# Patient Record
Sex: Male | Born: 1970 | Race: White | Hispanic: No | Marital: Married | Smoking: Former smoker
Health system: Southern US, Community
[De-identification: ages and names within clinical notes are randomized; demographics above are authoritative.]

## PROBLEM LIST (undated history)

## (undated) DIAGNOSIS — I208 Other forms of angina pectoris: Secondary | ICD-10-CM

## (undated) DIAGNOSIS — S86319A Strain of muscle(s) and tendon(s) of peroneal muscle group at lower leg level, unspecified leg, initial encounter: Secondary | ICD-10-CM

## (undated) DIAGNOSIS — M1811 Unilateral primary osteoarthritis of first carpometacarpal joint, right hand: Secondary | ICD-10-CM

## (undated) DIAGNOSIS — F17200 Nicotine dependence, unspecified, uncomplicated: Secondary | ICD-10-CM

## (undated) DIAGNOSIS — E785 Hyperlipidemia, unspecified: Secondary | ICD-10-CM

## (undated) DIAGNOSIS — N139 Obstructive and reflux uropathy, unspecified: Secondary | ICD-10-CM

## (undated) DIAGNOSIS — M75102 Unspecified rotator cuff tear or rupture of left shoulder, not specified as traumatic: Secondary | ICD-10-CM

## (undated) DIAGNOSIS — R079 Chest pain, unspecified: Secondary | ICD-10-CM

## (undated) DIAGNOSIS — Z982 Presence of cerebrospinal fluid drainage device: Secondary | ICD-10-CM

## (undated) DIAGNOSIS — M47816 Spondylosis without myelopathy or radiculopathy, lumbar region: Secondary | ICD-10-CM

## (undated) DIAGNOSIS — Z91030 Bee allergy status: Secondary | ICD-10-CM

## (undated) DIAGNOSIS — R52 Pain, unspecified: Secondary | ICD-10-CM

## (undated) DIAGNOSIS — M170 Bilateral primary osteoarthritis of knee: Secondary | ICD-10-CM

## (undated) DIAGNOSIS — Z0000 Encounter for general adult medical examination without abnormal findings: Secondary | ICD-10-CM

## (undated) DIAGNOSIS — M19071 Primary osteoarthritis, right ankle and foot: Secondary | ICD-10-CM

## (undated) HISTORY — PX: ANTERIOR CRUCIATE LIGAMENT REPAIR: SHX115

## (undated) HISTORY — DX: Obstructive and reflux uropathy, unspecified: N13.9

## (undated) HISTORY — DX: Encounter for general adult medical examination without abnormal findings: Z00.00

## (undated) HISTORY — DX: Nicotine dependence, unspecified, uncomplicated: F17.200

## (undated) HISTORY — DX: Chest pain, unspecified: R07.9

## (undated) HISTORY — DX: Unspecified rotator cuff tear or rupture of left shoulder, not specified as traumatic: M75.102

## (undated) HISTORY — DX: Spondylosis without myelopathy or radiculopathy, lumbar region: M47.816

## (undated) HISTORY — DX: Presence of cerebrospinal fluid drainage device: Z98.2

## (undated) HISTORY — DX: Bilateral primary osteoarthritis of knee: M17.0

## (undated) HISTORY — PX: VENTRICULOPERITONEAL SHUNT: SHX204

## (undated) HISTORY — DX: Unilateral primary osteoarthritis of first carpometacarpal joint, right hand: M18.11

## (undated) HISTORY — DX: Hyperlipidemia, unspecified: E78.5

## (undated) HISTORY — DX: Primary osteoarthritis, right ankle and foot: M19.071

## (undated) HISTORY — PX: OTHER SURGICAL HISTORY: SHX169

## (undated) HISTORY — DX: Strain of muscle(s) and tendon(s) of peroneal muscle group at lower leg level, unspecified leg, initial encounter: S86.319A

## (undated) HISTORY — DX: Other forms of angina pectoris: I20.8

---

## 2007-07-16 DIAGNOSIS — Z982 Presence of cerebrospinal fluid drainage device: Secondary | ICD-10-CM

## 2007-07-16 HISTORY — DX: Presence of cerebrospinal fluid drainage device: Z98.2

## 2008-08-15 HISTORY — PX: OTHER SURGICAL HISTORY: SHX169

## 2012-04-13 HISTORY — PX: OTHER SURGICAL HISTORY: SHX169

## 2012-06-08 ENCOUNTER — Encounter (HOSPITAL_COMMUNITY): Payer: Self-pay | Admitting: Pharmacy Technician

## 2012-06-09 ENCOUNTER — Other Ambulatory Visit: Payer: Self-pay | Admitting: Orthopedic Surgery

## 2012-06-16 ENCOUNTER — Encounter (HOSPITAL_COMMUNITY): Payer: Self-pay

## 2012-06-16 ENCOUNTER — Encounter (HOSPITAL_COMMUNITY)
Admission: RE | Admit: 2012-06-16 | Discharge: 2012-06-16 | Disposition: A | Discharge status: Home / Self Care | Payer: BC Managed Care – PPO | Source: Ambulatory Visit | Attending: Specialist | Admitting: Specialist

## 2012-06-16 HISTORY — DX: Presence of cerebrospinal fluid drainage device: Z98.2

## 2012-06-16 HISTORY — DX: Pain, unspecified: R52

## 2012-06-16 LAB — SURGICAL PCR SCREEN: Staphylococcus aureus: POSITIVE — AB

## 2012-06-16 NOTE — Patient Instructions (Signed)
YOUR SURGERY IS SCHEDULED AT [facility]  ON:  Thursday  12/5  AT 7:30 AM  REPORT TO [place] [facility] AT:  5:30 AM      PHONE # FOR SHORT STAY IS [phone]  DO NOT EAT OR DRINK ANYTHING AFTER MIDNIGHT THE NIGHT BEFORE YOUR SURGERY.  YOU MAY BRUSH YOUR TEETH, RINSE OUT YOUR MOUTH--BUT NO WATER, NO FOOD, NO CHEWING GUM, NO MINTS, NO CANDIES, NO CHEWING TOBACCO.  PLEASE TAKE THE FOLLOWING MEDICATIONS THE AM OF YOUR SURGERY WITH A FEW SIPS OF WATER:  NO MEDS TO TAKE   IF YOU USE INHALERS--USE YOUR INHALERS THE AM OF YOUR SURGERY AND BRING INHALERS TO THE HOSPITAL -TAKE TO SURGERY.    IF YOU ARE DIABETIC:  DO NOT TAKE ANY DIABETIC MEDICATIONS THE AM OF YOUR SURGERY.  IF YOU TAKE INSULIN IN THE EVENINGS--PLEASE ONLY TAKE 1/2 NORMAL EVENING DOSE THE NIGHT BEFORE YOUR SURGERY.  NO INSULIN THE AM OF YOUR SURGERY.  IF YOU HAVE SLEEP APNEA AND USE CPAP OR BIPAP--PLEASE BRING THE MASK AND THE TUBING.  DO NOT BRING YOUR MACHINE.  DO NOT BRING VALUABLES, MONEY, CREDIT CARDS.  DO NOT WEAR JEWELRY, MAKE-UP, NAIL POLISH AND NO METAL PINS OR CLIPS IN YOUR HAIR. CONTACT LENS, DENTURES / PARTIALS, GLASSES SHOULD NOT BE WORN TO SURGERY AND IN MOST CASES-HEARING AIDS WILL NEED TO BE REMOVED.  BRING YOUR GLASSES CASE, ANY EQUIPMENT NEEDED FOR YOUR CONTACT LENS. FOR PATIENTS ADMITTED TO THE HOSPITAL--CHECK OUT TIME THE DAY OF DISCHARGE IS 11:00 AM.  ALL INPATIENT ROOMS ARE PRIVATE - WITH BATHROOM, TELEPHONE, TELEVISION AND WIFI INTERNET.  IF YOU ARE BEING DISCHARGED THE SAME DAY OF YOUR SURGERY--YOU CAN NOT DRIVE YOURSELF HOME--AND SHOULD NOT GO HOME ALONE BY TAXI OR BUS.  NO DRIVING OR OPERATING MACHINERY FOR 24 HOURS FOLLOWING ANESTHESIA / PAIN MEDICATIONS.  PLEASE MAKE ARRANGEMENTS FOR SOMEONE TO BE WITH YOU AT HOME THE FIRST 24 HOURS AFTER SURGERY. RESPONSIBLE DRIVER'S NAME___________________________                                               PHONE #   _______________________                              PLEASE READ OVER ANY  FACT SHEETS THAT YOU WERE GIVEN: MRSA INFORMATION, BLOOD TRANSFUSION INFORMATION, INCENTIVE SPIROMETER INFORMATION.

## 2012-06-16 NOTE — Pre-Procedure Instructions (Signed)
PT STATES HE JUST HAD PHYSICAL THIS AM 06/16/12 AT DR. Domingo Cocking AT [place] AND HAD CBC, CMET, LIPID PANEL, TSH AND UA DONE AND PT BROUGHT EKG THAT WAS DONE-NORMAL.  LAB  REPORTS WILL BE REQUESTED.  PCR WAS DONE TODAY AT [facility] PREOP--NO OTHER LABS NEEDED. PT HAD HEART TREADMILL STRESS TEST AT [facility] 08/20/11--NORMAL - REPORT ON CHART.

## 2012-06-17 NOTE — Pre-Procedure Instructions (Signed)
PT'S CBC, CMET, UA, TSH, UA AND LIPID PANEL REPORTS ON CHART -DONE 06/16/12 AND FAXED BY HIS MEDICAL DOCTOR'S OFFICE--DR. JOBE WITH [facility].

## 2012-06-18 ENCOUNTER — Encounter (HOSPITAL_COMMUNITY): Payer: Self-pay | Admitting: Anesthesiology

## 2012-06-18 ENCOUNTER — Ambulatory Visit (HOSPITAL_COMMUNITY)
Admission: RE | Admit: 2012-06-18 | Discharge: 2012-06-18 | Disposition: A | Discharge status: Home / Self Care | Payer: BC Managed Care – PPO | Source: Ambulatory Visit | Attending: Specialist | Admitting: Specialist

## 2012-06-18 ENCOUNTER — Encounter (HOSPITAL_COMMUNITY): Payer: Self-pay | Admitting: *Deleted

## 2012-06-18 ENCOUNTER — Ambulatory Visit (HOSPITAL_COMMUNITY): Payer: BC Managed Care – PPO | Admitting: Anesthesiology

## 2012-06-18 ENCOUNTER — Encounter (HOSPITAL_COMMUNITY)
Admission: RE | Disposition: A | Discharge status: Home / Self Care | Payer: Self-pay | Source: Ambulatory Visit | Attending: Specialist

## 2012-06-18 DIAGNOSIS — Z5333 Arthroscopic surgical procedure converted to open procedure: Secondary | ICD-10-CM | POA: Insufficient documentation

## 2012-06-18 DIAGNOSIS — M25819 Other specified joint disorders, unspecified shoulder: Secondary | ICD-10-CM | POA: Insufficient documentation

## 2012-06-18 DIAGNOSIS — Z7982 Long term (current) use of aspirin: Secondary | ICD-10-CM | POA: Insufficient documentation

## 2012-06-18 DIAGNOSIS — M75102 Unspecified rotator cuff tear or rupture of left shoulder, not specified as traumatic: Secondary | ICD-10-CM

## 2012-06-18 DIAGNOSIS — Z01812 Encounter for preprocedural laboratory examination: Secondary | ICD-10-CM | POA: Insufficient documentation

## 2012-06-18 DIAGNOSIS — S43429A Sprain of unspecified rotator cuff capsule, initial encounter: Secondary | ICD-10-CM | POA: Insufficient documentation

## 2012-06-18 DIAGNOSIS — Z79899 Other long term (current) drug therapy: Secondary | ICD-10-CM | POA: Insufficient documentation

## 2012-06-18 DIAGNOSIS — X58XXXA Exposure to other specified factors, initial encounter: Secondary | ICD-10-CM | POA: Insufficient documentation

## 2012-06-18 DIAGNOSIS — Z982 Presence of cerebrospinal fluid drainage device: Secondary | ICD-10-CM | POA: Insufficient documentation

## 2012-06-18 HISTORY — PX: SHOULDER ARTHROSCOPY WITH ROTATOR CUFF REPAIR AND SUBACROMIAL DECOMPRESSION: SHX5686

## 2012-06-18 HISTORY — DX: Unspecified rotator cuff tear or rupture of left shoulder, not specified as traumatic: M75.102

## 2012-06-18 SURGERY — SHOULDER ARTHROSCOPY WITH ROTATOR CUFF REPAIR AND SUBACROMIAL DECOMPRESSION
Anesthesia: General | Site: Shoulder | Laterality: Left | Wound class: Clean

## 2012-06-18 MED ORDER — STERILE WATER FOR IRRIGATION IR SOLN
Status: DC | PRN
  Administered 2012-06-18: 09:00:00

## 2012-06-18 MED ORDER — CEFAZOLIN SODIUM-DEXTROSE 2-3 GM-% IV SOLR
2.0000 g | Freq: Three times a day (TID) | INTRAVENOUS | Status: DC
  Filled 2012-06-18 (×2): qty 50

## 2012-06-18 MED ORDER — VANCOMYCIN HCL 1000 MG IV SOLR
1000.0000 mg | INTRAVENOUS | Status: DC | PRN
  Administered 2012-06-18: 1000 mg via INTRAVENOUS

## 2012-06-18 MED ORDER — ACETAMINOPHEN 650 MG RE SUPP: 650.0000 mg | Freq: Four times a day (QID) | RECTAL | Status: DC | PRN

## 2012-06-18 MED ORDER — ONDANSETRON HCL 4 MG/2ML IJ SOLN: 4.0000 mg | Freq: Four times a day (QID) | INTRAMUSCULAR | Status: DC | PRN

## 2012-06-18 MED ORDER — CEFAZOLIN SODIUM-DEXTROSE 2-3 GM-% IV SOLR
INTRAVENOUS | Status: AC
  Filled 2012-06-18: qty 50

## 2012-06-18 MED ORDER — KETOROLAC TROMETHAMINE 30 MG/ML IJ SOLN
30.0000 mg | Freq: Four times a day (QID) | INTRAMUSCULAR | Status: DC
  Administered 2012-06-18: 30 mg via INTRAVENOUS
  Filled 2012-06-18: qty 1

## 2012-06-18 MED ORDER — VANCOMYCIN HCL 10 G IV SOLR
1250.0000 mg | Freq: Two times a day (BID) | INTRAVENOUS | Status: DC
  Filled 2012-06-18: qty 1250

## 2012-06-18 MED ORDER — VANCOMYCIN HCL IN DEXTROSE 1-5 GM/200ML-% IV SOLN
INTRAVENOUS | Status: AC
  Filled 2012-06-18: qty 200

## 2012-06-18 MED ORDER — CHLORHEXIDINE GLUCONATE 4 % EX LIQD
60.0000 mL | Freq: Once | CUTANEOUS | Status: DC
  Filled 2012-06-18: qty 60

## 2012-06-18 MED ORDER — MENTHOL 3 MG MT LOZG: 1.0000 | LOZENGE | OROMUCOSAL | Status: DC | PRN

## 2012-06-18 MED ORDER — ENOXAPARIN SODIUM 40 MG/0.4ML ~~LOC~~ SOLN
40.0000 mg | SUBCUTANEOUS | Status: DC
  Filled 2012-06-18: qty 0.4

## 2012-06-18 MED ORDER — HYDROMORPHONE HCL PF 1 MG/ML IJ SOLN
INTRAMUSCULAR | Status: AC
  Filled 2012-06-18: qty 1

## 2012-06-18 MED ORDER — OXYCODONE-ACETAMINOPHEN 5-325 MG PO TABS
1.0000 | ORAL_TABLET | ORAL | Status: DC | PRN
  Administered 2012-06-18: 2 via ORAL
  Filled 2012-06-18: qty 2

## 2012-06-18 MED ORDER — CEFAZOLIN SODIUM-DEXTROSE 2-3 GM-% IV SOLR
2.0000 g | INTRAVENOUS | Status: AC
  Administered 2012-06-18: 2 g via INTRAVENOUS

## 2012-06-18 MED ORDER — PHENOL 1.4 % MT LIQD: 1.0000 | OROMUCOSAL | Status: DC | PRN

## 2012-06-18 MED ORDER — METHOCARBAMOL 500 MG PO TABS: 500.0000 mg | ORAL_TABLET | Freq: Three times a day (TID) | ORAL | Status: DC

## 2012-06-18 MED ORDER — BUPIVACAINE-EPINEPHRINE 0.5% -1:200000 IJ SOLN
INTRAMUSCULAR | Status: DC | PRN
  Administered 2012-06-18: 20 mL

## 2012-06-18 MED ORDER — LACTATED RINGERS IV SOLN: INTRAVENOUS | Status: DC

## 2012-06-18 MED ORDER — METHOCARBAMOL 100 MG/ML IJ SOLN
500.0000 mg | Freq: Four times a day (QID) | INTRAVENOUS | Status: DC | PRN
  Administered 2012-06-18: 500 mg via INTRAVENOUS
  Filled 2012-06-18: qty 5

## 2012-06-18 MED ORDER — LACTATED RINGERS IR SOLN
Status: DC | PRN
  Administered 2012-06-18: 3000 mL

## 2012-06-18 MED ORDER — ACETAMINOPHEN 10 MG/ML IV SOLN
INTRAVENOUS | Status: DC | PRN
  Administered 2012-06-18: 1000 mg via INTRAVENOUS

## 2012-06-18 MED ORDER — BUPIVACAINE-EPINEPHRINE 0.5% -1:200000 IJ SOLN
INTRAMUSCULAR | Status: AC
  Filled 2012-06-18: qty 1

## 2012-06-18 MED ORDER — LACTATED RINGERS IV SOLN
INTRAVENOUS | Status: DC
  Administered 2012-06-18 (×2): via INTRAVENOUS

## 2012-06-18 MED ORDER — MIDAZOLAM HCL 5 MG/5ML IJ SOLN
INTRAMUSCULAR | Status: DC | PRN
  Administered 2012-06-18: 2 mg via INTRAVENOUS

## 2012-06-18 MED ORDER — DEXAMETHASONE SODIUM PHOSPHATE 10 MG/ML IJ SOLN
INTRAMUSCULAR | Status: DC | PRN
  Administered 2012-06-18: 10 mg via INTRAVENOUS

## 2012-06-18 MED ORDER — ACETAMINOPHEN 325 MG PO TABS: 650.0000 mg | ORAL_TABLET | Freq: Four times a day (QID) | ORAL | Status: DC | PRN

## 2012-06-18 MED ORDER — LIDOCAINE HCL (CARDIAC) 20 MG/ML IV SOLN
INTRAVENOUS | Status: DC | PRN
  Administered 2012-06-18: 50 mg via INTRAVENOUS

## 2012-06-18 MED ORDER — DOCUSATE SODIUM 100 MG PO CAPS: 100.0000 mg | ORAL_CAPSULE | Freq: Two times a day (BID) | ORAL | Status: DC

## 2012-06-18 MED ORDER — DOXYCYCLINE HYCLATE 100 MG PO CAPS: 100.0000 mg | ORAL_CAPSULE | Freq: Two times a day (BID) | ORAL | Status: DC

## 2012-06-18 MED ORDER — ONDANSETRON HCL 4 MG PO TABS: 4.0000 mg | ORAL_TABLET | Freq: Four times a day (QID) | ORAL | Status: DC | PRN

## 2012-06-18 MED ORDER — PROPOFOL 10 MG/ML IV BOLUS
INTRAVENOUS | Status: DC | PRN
  Administered 2012-06-18: 200 mg via INTRAVENOUS

## 2012-06-18 MED ORDER — ACETAMINOPHEN 10 MG/ML IV SOLN
INTRAVENOUS | Status: AC
  Filled 2012-06-18: qty 100

## 2012-06-18 MED ORDER — OXYCODONE-ACETAMINOPHEN 7.5-325 MG PO TABS: 1.0000 | ORAL_TABLET | ORAL | Status: DC | PRN

## 2012-06-18 MED ORDER — ONDANSETRON HCL 4 MG/2ML IJ SOLN
INTRAMUSCULAR | Status: DC | PRN
  Administered 2012-06-18: 4 mg via INTRAVENOUS

## 2012-06-18 MED ORDER — METHOCARBAMOL 500 MG PO TABS: 500.0000 mg | ORAL_TABLET | Freq: Four times a day (QID) | ORAL | Status: DC | PRN

## 2012-06-18 MED ORDER — EPINEPHRINE HCL 1 MG/ML IJ SOLN
INTRAMUSCULAR | Status: DC | PRN
  Administered 2012-06-18: 2 mg

## 2012-06-18 MED ORDER — SUCCINYLCHOLINE CHLORIDE 20 MG/ML IJ SOLN
INTRAMUSCULAR | Status: DC | PRN
  Administered 2012-06-18: 100 mg via INTRAVENOUS

## 2012-06-18 MED ORDER — HYDROMORPHONE HCL PF 1 MG/ML IJ SOLN
0.2500 mg | INTRAMUSCULAR | Status: DC | PRN
  Administered 2012-06-18: 1 mg via INTRAVENOUS
  Administered 2012-06-18 (×2): 0.5 mg via INTRAVENOUS

## 2012-06-18 MED ORDER — EPHEDRINE SULFATE 50 MG/ML IJ SOLN
INTRAMUSCULAR | Status: DC | PRN
  Administered 2012-06-18 (×2): 5 mg via INTRAVENOUS

## 2012-06-18 MED ORDER — EPINEPHRINE HCL 1 MG/ML IJ SOLN
INTRAMUSCULAR | Status: AC
  Filled 2012-06-18: qty 2

## 2012-06-18 MED ORDER — SODIUM CHLORIDE 0.45 % IV SOLN
INTRAVENOUS | Status: DC
  Administered 2012-06-18: 11:00:00 via INTRAVENOUS

## 2012-06-18 MED ORDER — HYDROCODONE-ACETAMINOPHEN 7.5-325 MG PO TABS: 1.0000 | ORAL_TABLET | ORAL | Status: DC | PRN

## 2012-06-18 MED ORDER — HYDROMORPHONE HCL PF 1 MG/ML IJ SOLN
0.5000 mg | INTRAMUSCULAR | Status: DC | PRN
  Administered 2012-06-18: 0.5 mg via INTRAVENOUS
  Filled 2012-06-18: qty 1

## 2012-06-18 MED ORDER — FENTANYL CITRATE 0.05 MG/ML IJ SOLN
INTRAMUSCULAR | Status: DC | PRN
  Administered 2012-06-18 (×2): 100 ug via INTRAVENOUS
  Administered 2012-06-18: 50 ug via INTRAVENOUS

## 2012-06-18 MED ORDER — METOCLOPRAMIDE HCL 5 MG/ML IJ SOLN: 5.0000 mg | Freq: Three times a day (TID) | INTRAMUSCULAR | Status: DC | PRN

## 2012-06-18 MED ORDER — METOCLOPRAMIDE HCL 10 MG PO TABS: 5.0000 mg | ORAL_TABLET | Freq: Three times a day (TID) | ORAL | Status: DC | PRN

## 2012-06-18 SURGICAL SUPPLY — 43 items
ANCHOR ROTATOR CUFF #2 (Anchor) ×2 IMPLANT
BLADE CUTTER GATOR 3.5 (BLADE) ×2 IMPLANT
BLADE SURG SZ11 CARB STEEL (BLADE) ×2 IMPLANT
BUR OVAL 4.0 (BURR) ×2 IMPLANT
CANNULA ACUFO 5X76 (CANNULA) ×2 IMPLANT
CHLORAPREP W/TINT 26ML (MISCELLANEOUS) IMPLANT
CLOTH BEACON ORANGE TIMEOUT ST (SAFETY) ×2 IMPLANT
DECANTER SPIKE VIAL GLASS SM (MISCELLANEOUS) ×2 IMPLANT
DRAPE ORTHO SPLIT 77X108 STRL (DRAPES)
DRAPE SURG ORHT 6 SPLT 77X108 (DRAPES) IMPLANT
DRAPE U-SHAPE 47X51 STRL (DRAPES) ×2 IMPLANT
DRSG EMULSION OIL 3X3 NADH (GAUZE/BANDAGES/DRESSINGS) ×2 IMPLANT
DRSG PAD ABDOMINAL 8X10 ST (GAUZE/BANDAGES/DRESSINGS) ×2 IMPLANT
DURAPREP 26ML APPLICATOR (WOUND CARE) ×2 IMPLANT
GLOVE BIOGEL PI IND STRL 7.5 (GLOVE) ×1 IMPLANT
GLOVE BIOGEL PI IND STRL 8 (GLOVE) ×1 IMPLANT
GLOVE BIOGEL PI INDICATOR 7.5 (GLOVE) ×1
GLOVE BIOGEL PI INDICATOR 8 (GLOVE) ×1
GLOVE SURG SS PI 7.0 STRL IVOR (GLOVE) ×2 IMPLANT
GLOVE SURG SS PI 8.0 STRL IVOR (GLOVE) ×4 IMPLANT
GOWN PREVENTION PLUS LG XLONG (DISPOSABLE) ×2 IMPLANT
GOWN PREVENTION PLUS XLARGE (GOWN DISPOSABLE) ×2 IMPLANT
GOWN STRL REIN XL XLG (GOWN DISPOSABLE) ×2 IMPLANT
KIT BASIN OR (CUSTOM PROCEDURE TRAY) ×2 IMPLANT
MANIFOLD NEPTUNE II (INSTRUMENTS) ×2 IMPLANT
NEEDLE SPNL 18GX3.5 QUINCKE PK (NEEDLE) ×2 IMPLANT
PACK SHOULDER CUSTOM OPM052 (CUSTOM PROCEDURE TRAY) ×2 IMPLANT
POSITIONER SURGICAL ARM (MISCELLANEOUS) ×2 IMPLANT
PUSHLOCK PEEK 4.5X24 (Orthopedic Implant) ×2 IMPLANT
SET ARTHROSCOPY TUBING (MISCELLANEOUS) ×1
SET ARTHROSCOPY TUBING LN (MISCELLANEOUS) ×1 IMPLANT
SLING ARM IMMOBILIZER LRG (SOFTGOODS) IMPLANT
SLING ARM IMMOBILIZER MED (SOFTGOODS) ×2 IMPLANT
SPONGE GAUZE 4X4 12PLY (GAUZE/BANDAGES/DRESSINGS) ×2 IMPLANT
STRAP CHIN BCCS-OSI (MISCELLANEOUS) ×2 IMPLANT
STRIP CLOSURE SKIN 1/2X4 (GAUZE/BANDAGES/DRESSINGS) ×4 IMPLANT
SUT ETHILON 4 0 PS 2 18 (SUTURE) ×2 IMPLANT
SUT PROLENE 3 0 PS 1 (SUTURE) ×2 IMPLANT
SUT VIC AB 1-0 CT2 27 (SUTURE) ×2 IMPLANT
SUT VIC AB 2-0 CT2 27 (SUTURE) ×2 IMPLANT
SUT VICRYL 0-0 OS 2 NEEDLE (SUTURE) ×4 IMPLANT
TUBING CONNECTING 10 (TUBING) ×2 IMPLANT
WAND 90 DEG TURBOVAC W/CORD (SURGICAL WAND) ×2 IMPLANT

## 2012-06-18 NOTE — Anesthesia Preprocedure Evaluation (Signed)
Anesthesia Evaluation  Patient identified by MRN, date of birth, ID band Patient awake    Reviewed: Allergy & Precautions, H&P , NPO status , Patient's Chart, lab work & pertinent test results  Airway Mallampati: II TM Distance: >3 FB Neck ROM: full    Dental No notable dental hx. 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 negative neurological ROS  negative psych ROS   GI/Hepatic negative GI ROS, Neg liver ROS,   Endo/Other  negative endocrine ROS  Renal/GU negative Renal ROS  negative genitourinary   Musculoskeletal   Abdominal   Peds  Hematology negative hematology ROS (+)   Anesthesia Other Findings   Reproductive/Obstetrics negative OB ROS                           Anesthesia Physical Anesthesia Plan  ASA: II  Anesthesia Plan: General   Post-op Pain Management:    Induction: Intravenous  Airway Management Planned: Oral ETT  Additional Equipment:   Intra-op Plan:   Post-operative Plan: Extubation in OR  Informed Consent: I have reviewed the patients History and Physical, chart, labs and discussed the procedure including the risks, benefits and alternatives for the proposed anesthesia with the patient or authorized representative who has indicated his/her understanding and acceptance.   Dental Advisory Given  Plan Discussed with: CRNA and Surgeon  Anesthesia Plan Comments:         Anesthesia Quick Evaluation

## 2012-06-18 NOTE — Anesthesia Procedure Notes (Signed)
Procedure Name: Intubation Date/Time: 06/18/2012 7:44 AM Performed by: Doran Clay Pre-anesthesia Checklist: Patient identified, Timeout performed, Emergency Drugs available, Suction available and Patient being monitored Patient Re-evaluated:Patient Re-evaluated prior to inductionOxygen Delivery Method: Circle system utilized Preoxygenation: Pre-oxygenation with 100% oxygen Intubation Type: IV induction Laryngoscope Size: Mac and 3 Grade View: Grade II Tube type: Oral Tube size: 7.5 mm Number of attempts: 1 Airway Equipment and Method: Stylet and LTA kit utilized Placement Confirmation: ETT inserted through vocal cords under direct vision,  positive ETCO2 and breath sounds checked- equal and bilateral Secured at: 23 cm Tube secured with: Tape Dental Injury: Teeth and Oropharynx as per pre-operative assessment

## 2012-06-18 NOTE — Discharge Summary (Signed)
Patient ID: Frederick Ward MRN: [id number] DOB/AGE: April 04, 1971 41 y.o.  Admit date: 06/18/2012 Discharge date: 06/18/2012  Admission Diagnoses:  Principal Problem:  *Rotator cuff tear, left   Discharge Diagnoses:  Same  Past Medical History  Diagnosis Date  . Pain     LEFT SHOULDER- ROTATOR CUFF  TEAR  . 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 male who was admitted 06/18/2012 for operative treatment ofRotator cuff tear, left. Patient has severe unremitting pain that affects sleep, daily activities, and work/hobbies. After pre-op clearance the patient was taken to the operating room on 06/18/2012 and underwent  Procedure(s): SHOULDER ARTHROSCOPY WITH ROTATOR CUFF REPAIR AND SUBACROMIAL DECOMPRESSION.    Patient was given perioperative antibiotics: Anti-infectives     Start     Dose/Rate Route Frequency Ordered Stop   06/18/12 1900   vancomycin (VANCOCIN) 1,250 mg in sodium chloride 0.9 % 250 mL IVPB        1,250 mg 166.7 mL/hr over 90 Minutes Intravenous Every 12 hours 06/18/12 1105 06/19/12 0659   06/18/12 1600   ceFAZolin (ANCEF) IVPB 2 g/50 mL premix        2 g 100 mL/hr over 30 Minutes Intravenous Every 8 hours 06/18/12 1105     06/18/12 0856   polymyxin B 500,000 Units, bacitracin 50,000 Units in sterile water for irrigation 500 mL irrigation  Status:  Discontinued          As needed 06/18/12 0857 06/18/12 0924   06/18/12 0536   ceFAZolin (ANCEF) IVPB 2 g/50 mL premix        2 g 100 mL/hr over 30 Minutes Intravenous 60 min pre-op 06/18/12 0536 06/18/12 0745   06/18/12 0000   doxycycline (VIBRAMYCIN) 100 MG capsule        100 mg Oral 2 times daily 06/18/12 6644              Patient was given sequential compression devices, early ambulation, and chemoprophylaxis to prevent DVT.  Patient benefited maximally from hospital stay and there were no complications.    Recent vital signs: Patient Vitals for the past 24 hrs:  BP Temp Temp src Pulse Resp SpO2 Height Weight  06/18/12 1245 120/70 mmHg - - 95  16  96 % - -  06/18/12 1145 121/66 mmHg 98 F (36.7 Ward) - 68  16  - - -  06/18/12 1045 130/79 mmHg 98.3 F (36.8 Ward) Oral 96  16  100 % 5\' 7"  (1.702 m) 74.844 kg (165 lb)  06/18/12 1037 135/76 mmHg - - 94  14  97 % - -  06/18/12 1030 - - - 101  16  97 % - -  06/18/12 1015 132/71 mmHg - - 99  13  98 % - -  06/18/12 1000 130/77 mmHg - - 106  11  96 % - -  06/18/12 0955 - - - 107  16  97 % - -  06/18/12 0945 127/70 mmHg - - 102  15  100 % - -  06/18/12 0930 108/59 mmHg 98 F (36.7 Ward) - 102  21  100 % - -  06/18/12 0536 123/75 mmHg 98.8 F (37.1 Ward) - 90  20  98 % - -  Recent laboratory studies: No results found for this basename: WBC:2,HGB:2,HCT:2,PLT:2,NA:2,K:2,CL:2,CO2:2,BUN:2,CREATININE:2,GLUCOSE:2,PT:2,INR:2,CALCIUM,2: in the last 72 hours   Discharge Medications:     Medication List     As of 06/18/2012  3:15 PM    TAKE these medications         aspirin EC 81 MG tablet   Take 81 mg by mouth daily.      doxycycline 100 MG capsule   Commonly known as: VIBRAMYCIN   Take 1 capsule (100 mg total) by mouth 2 (two) times daily.      EPIPEN 2-PAK 0.3 mg/0.3 mL Devi   Generic drug: EPINEPHrine   Inject 0.3 mg into the muscle once. For yellow jackets      fish oil-omega-3 fatty acids 1000 MG capsule   Take 1 g by mouth daily.      methocarbamol 500 MG tablet   Commonly known as: ROBAXIN   Take 1 tablet (500 mg total) by mouth 3 (three) times daily.      multivitamin with minerals Tabs   Take 1 tablet by mouth daily.      oxyCODONE-acetaminophen 7.5-325 MG per tablet   Commonly known as: PERCOCET   Take 1-2 tablets by mouth every 4 (four)  hours as needed for pain.        Diagnostic Studies: No results found.  Disposition: Final discharge disposition not confirmed      Discharge Orders    Future Orders Please Complete By Expires   Diet - low sodium heart healthy      Call MD / Call 911      Comments:   If you experience chest pain or shortness of breath, CALL 911 and be transported to the hospital emergency room.  If you develope a fever above 101 F, pus (white drainage) or increased drainage or redness at the wound, or calf pain, call your surgeon's office.   Constipation Prevention      Comments:   Drink plenty of fluids.  Prune juice may be helpful.  You may use a stool softener, such as Colace (over the counter) 100 mg twice a day.  Use MiraLax (over the counter) for constipation as needed.   Increase activity slowly as tolerated         Follow-up Information    In 10 days to follow up.          Signed: Wyett Narine Ward 06/18/2012, 3:15 PM

## 2012-06-18 NOTE — Transfer of Care (Signed)
Immediate Anesthesia Transfer of Care Note  Patient: Frederick Ward  Procedure(s) Performed: Procedure(s) (LRB) with comments: SHOULDER ARTHROSCOPY WITH ROTATOR CUFF REPAIR AND SUBACROMIAL DECOMPRESSION (Left) - Left Shoulder arthroscopy subacromial decompression mini open rotator cuff repair   Patient Location: PACU  Anesthesia Type:General  Level of Consciousness: awake and sedated  Airway & Oxygen Therapy: Patient Spontanous Breathing and Patient connected to face mask oxygen  Post-op Assessment: Report given to PACU RN and Post -op Vital signs reviewed and stable  Post vital signs: Reviewed and stable  Complications: No apparent anesthesia complications

## 2012-06-18 NOTE — Plan of Care (Signed)
Problem: Discharge Progression Outcomes Goal: Other Discharge Outcomes/Goals Outcome: Completed/Met Date Met:  06/18/12 ot teaching

## 2012-06-18 NOTE — H&P (Signed)
Frederick Ward is an 41 y.o. male.   Chief Complaint: Left shoulder pain HPI: RC tear left  Past Medical History  Diagnosis Date  . Pain     LEFT SHOULDER- ROTATOR CUFF  TEAR  . 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 Right ankle tendon transfer SEPT 30, 2013    SURGERY WAS DONE IN [facility] [facility]--PT WEARS  ORTHOTIC BOOT WHEN AMBULATING--HEALING INCISION--SOME WEAKNESS STILL  . Ventriculoperitoneal shunt   . Anterior cruciate ligament repair 1997 OR 1998    LEFT KNEE  . Right wrist fixation aug  2006   . Left ankle tendon transfer FEB 2010    History reviewed. No pertinent family history. Social History:  reports that he has quit smoking. His smoking use included Cigarettes. He has a 20 pack-year smoking history. He has never used smokeless tobacco. He reports that he does not drink alcohol or use illicit drugs.  Allergies: No Known Allergies  Medications Prior to Admission  Medication Sig Dispense Refill  . aspirin EC 81 MG tablet Take 81 mg by mouth daily.      Marland Kitchen EPINEPHrine (EPIPEN 2-PAK) 0.3 mg/0.3 mL DEVI Inject 0.3 mg into the muscle once. For yellow jackets      . fish oil-omega-3 fatty acids 1000 MG capsule Take 1 g by mouth daily.      . Multiple Vitamin (MULTIVITAMIN WITH MINERALS) TABS Take 1 tablet by mouth daily.        Results for orders placed during the hospital encounter of 06/16/12 (from the past 48 hour(s))  SURGICAL PCR SCREEN     Status: Abnormal   Collection Time   06/16/12 11:05 AM      Component Value Range Comment   MRSA, PCR POSITIVE (*) NEGATIVE    Staphylococcus aureus POSITIVE (*) NEGATIVE    No results found.  Review of Systems  Musculoskeletal: Positive for joint pain.  Neurological: Negative for focal weakness.    Blood pressure 123/75, pulse 90, temperature 98.8 F (37.1 C), resp. rate 20,  SpO2 98.00%. Physical Exam  Vitals reviewed. Constitutional: He is oriented to person, place, and time. He appears well-developed.  HENT:  Head: Normocephalic.  Eyes: Pupils are equal, round, and reactive to light.  Neck: Normal range of motion.  Cardiovascular: Normal rate.   Respiratory: Effort normal.  GI: Soft.  Neurological: He is alert and oriented to person, place, and time.  Skin: Skin is warm and dry.  Psychiatric: He has a normal mood and affect.   Impingement left shoulder. NT AC. Wear ER  Assessment/Plan Left shoulder RC tear. Plan LSA SAD mini RCR. Risks discussed.  Frederick Ward C 06/18/2012, 7:33 AM

## 2012-06-18 NOTE — Evaluation (Signed)
Occupational Therapy Evaluation Patient Details Name: Frederick Ward MRN: [id number] DOB: 02-21-1971 Today's Date: 06/18/2012 Time: 4098-1191 OT Time Calculation (min): 17 min  OT Assessment / Plan / Recommendation Clinical Impression  Pt doing well POD 0 L RCR. All education completed. Pt will have necessary level of A from family. Wife stated that Dr Shelle Iron had demonstrated to her how to perform pendulum ex in waiting area although not in order set.    OT Assessment  Progress rehab of shoulder as ordered by MD at follow-up appointment    Follow Up Recommendations  No OT follow up    Barriers to Discharge      Equipment Recommendations  None recommended by OT    Recommendations for Other Services    Frequency       Precautions / Restrictions Precautions Precautions: Shoulder Required Braces or Orthoses: Other Brace/Splint Other Brace/Splint: LUE sling   Pertinent Vitals/Pain Denied pain    ADL       OT Diagnosis:    OT Problem List:   OT Treatment Interventions:     OT Goals    Visit Information  Last OT Received On: 06/18/12 Assistance Needed: +1    Subjective Data  Subjective: I feel fine. Patient Stated Goal: Return home today.   Prior Functioning     Home Living Lives With: Spouse Prior Function Comments: pt is independent with all functional mobility and wife will be assisting with ADLs.         Vision/Perception     Cognition  Overall Cognitive Status: Appears within functional limits for tasks assessed/performed Arousal/Alertness: Awake/alert Orientation Level: Appears intact for tasks assessed Behavior During Session: [facility] for tasks performed    Extremity/Trunk Assessment       Mobility       Shoulder Instructions Donning/doffing shirt without moving shoulder: Independent Method for sponge bathing under operated UE: Caregiver independent with task Donning/doffing sling/immobilizer: Caregiver independent with task Correct positioning  of sling/immobilizer: Independent ROM for elbow, wrist and digits of operated UE: Independent Sling wearing schedule (on at all times/off for ADL's): Independent Proper positioning of operated UE when showering: Independent Dressing change: Caregiver independent with task Positioning of UE while sleeping: Caregiver independent with task   Exercise     Balance     End of Session OT - End of Session Activity Tolerance: Patient tolerated treatment well Patient left: with family/visitor present;with call bell/phone within reach;Other (comment) (ambulating around room.)  GO Functional Assessment Tool Used: Clinical Judgement Functional Limitation: Self care Self Care Current Status (Y7829): At least 1 percent but less than 20 percent impaired, limited or restricted Self Care Goal Status (F6213): 0 percent impaired, limited or restricted   Mallisa Alameda A OTR/L [phone] 06/18/2012, 4:25 PM

## 2012-06-18 NOTE — Brief Op Note (Signed)
06/18/2012  9:13 AM  PATIENT:  Loreli Dollar  41 y.o. male  PRE-OPERATIVE DIAGNOSIS:  rotator cuff tear on left   POST-OPERATIVE DIAGNOSIS:  rotator cuff tear on left  PROCEDURE:  Procedure(s) (LRB) with comments: SHOULDER ARTHROSCOPY WITH ROTATOR CUFF REPAIR AND SUBACROMIAL DECOMPRESSION (Left) - Left Shoulder arthroscopy subacromial decompression mini open rotator cuff repair   SURGEON:  Surgeon(s) and Role:    * Javier Docker, MD - Primary  PHYSICIAN ASSISTANT:   ASSISTANTS: Bissell   ANESTHESIA:   general  EBL:  Total I/O In: 1000 [I.V.:1000] Out: -   BLOOD ADMINISTERED:none  DRAINS: none   LOCAL MEDICATIONS USED:  MARCAINE     SPECIMEN:  No Specimen  DISPOSITION OF SPECIMEN:  PATHOLOGY  COUNTS:  YES  TOURNIQUET:  * No tourniquets in log *  DICTATION: .Other Dictation: Dictation Number [id number]  PLAN OF CARE: Admit for overnight observation  PATIENT DISPOSITION:  PACU - hemodynamically stable.   Delay start of Pharmacological VTE agent (>24hrs) due to surgical blood loss or risk of bleeding: no

## 2012-06-18 NOTE — Op Note (Signed)
Frederick Ward, Frederick Ward               ACCOUNT NO.:  [id number]  MEDICAL RECORD NO.:  [id number]  LOCATION:  1613                         FACILITY:  [facility]  PHYSICIAN:  Jene Every, M.D.    DATE OF BIRTH:  Jul 24, 1970  DATE OF PROCEDURE:  06/18/2012 DATE OF DISCHARGE:  06/18/2012                              OPERATIVE REPORT   PREOPERATIVE DIAGNOSES:  Impingement syndrome, rotator cuff tear of the left shoulder.  POSTOPERATIVE DIAGNOSIS:  Impingement syndrome, rotator cuff tear of the left shoulder.  PROCEDURE PERFORMED: 1. Left shoulder arthroscopy. 2. Subacromial decompression. 3. Mini open rotator cuff repair, acromioplasty.  ANESTHESIA:  General.  ASSISTANT:  Lanna Poche, PA  HISTORY:  This is a 41 year old male with persistent pain, had an MRI a year ago with less than 50% tear.  He had progressive worsening of his pain despite conservative treatment including therapy and corticosteroid injections.  He was indicated for arthroscopic subacromial decompression, possible open rotator cuff repair.  He had a lateral sloping acromion, AC arthrosis, but no significant symptoms associated with that.  We discussed decompression and/or plus possibility of repair if significant tear was noted.  Risk and benefits discussed including bleeding, infection, damage to neurovascular structures, DVT, PE, anesthetic complications, etc.  TECHNIQUE:  With the patient in supine beach-chair position after induction of adequate general anesthesia, 1 g vancomycin and 2 of Kefzol due to MRSA positivity and exam under anesthesia which revealed full range.  Left shoulder and upper extremity was prepped and draped in usual sterile fashion.  Surgical marker was utilized to delineate the acromion, AC joint, and coracoid.  Standard posterolateral and anterolateral portals were utilized with incision through the skin only. With the arm in 70/30 position with gentle traction applied, we  advanced the arthroscopic cannula into the glenohumeral joint penetrating atraumatically.  This was in line with coracoid.  A 65 mm of fluid pressure was utilized and the joint was lavaged.  Inspection revealed the glenoid was unremarkable as was the humerus.  Subscap was detached. The labrum and the biceps was unremarkable within its groove, no tearing.  Just lateral to the bicipital groove in the supraspinatus, there was a tear at the articular surface, portion of the rotator cuff supraspinatus small.  I redirected the camera in the subacromial space, made a lateral portal triangulating. Hypertrophic bursa was noted and this was shaved and excised.  He had an impingement lesion, hypertrophic thickened coracoacromial ligament.  This was released with ArthroWand. Significant acromion was noted.  Full inspection of the rotator cuff with a probe did reveal a small tear on the bursal side as well in the location of the articular tear and therefore decided to convert this to a mini open procedure, a cm in length, removed all arthroscopic equipment.  I made a small 2 cm incision over the anterolateral aspect of the acromion.  Identified the raphe between the anterolateral heads, divided it, and subperiosteally elevated the anterolateral and anteromedial aspect of the acromion a few mm identifying the spur, removing it with 3 mm Kerrison, digitally lysing adhesions in subacromial joint, excising any further bursa and the tear was noted in the location seen arthroscopically.  I made  a small incision with a 15 blade, a cm in length, debrided the tendon and repaired it side-to-side with 1 Vicryl interrupted figure-of-eight sutures.  The attachment to the greater tuberosity was intact.  Remainder of the cuff was unremarkable and he had good subacromial space.  We therefore repaired the raphe with 1 Vicryl interrupted figure-of-8 sutures over and top through the acromion, subcu with 2-0 Vicryl  simple sutures.  Skin was approximated with 4-0 subcuticular Prolene.  Wound reinforced with Steri- Strips.  Sterile dressing applied.  Placed supine on hospital bed, extubated without difficulty, and transported to the recovery room in satisfactory condition.  The patient tolerated the procedure well.  No complications.  No blood loss.  Assistant Lanna Poche, [place].     Jene Every, M.D.     Cordelia Pen  D:  06/18/2012  T:  06/18/2012  Job:  [id number]

## 2012-06-18 NOTE — Anesthesia Postprocedure Evaluation (Signed)
  Anesthesia Post-op Note  Patient: Frederick Ward  Procedure(s) Performed: Procedure(s) (LRB): SHOULDER ARTHROSCOPY WITH ROTATOR CUFF REPAIR AND SUBACROMIAL DECOMPRESSION (Left)  Patient Location: PACU  Anesthesia Type: General  Level of Consciousness: awake and alert   Airway and Oxygen Therapy: Patient Spontanous Breathing  Post-op Pain: mild  Post-op Assessment: Post-op Vital signs reviewed, Patient's Cardiovascular Status Stable, Respiratory Function Stable, Patent Airway and No signs of Nausea or vomiting  Last Vitals:  Filed Vitals:   06/18/12 1000  BP:   Pulse: 106  Temp:   Resp: 11    Post-op Vital Signs: stable   Complications: No apparent anesthesia complications

## 2012-06-19 ENCOUNTER — Encounter (HOSPITAL_COMMUNITY): Payer: Self-pay | Admitting: Specialist

## 2014-03-21 DIAGNOSIS — E785 Hyperlipidemia, unspecified: Secondary | ICD-10-CM | POA: Insufficient documentation

## 2014-03-21 DIAGNOSIS — E782 Mixed hyperlipidemia: Secondary | ICD-10-CM | POA: Insufficient documentation

## 2014-03-21 DIAGNOSIS — Z889 Allergy status to unspecified drugs, medicaments and biological substances status: Secondary | ICD-10-CM

## 2014-03-21 HISTORY — DX: Allergy status to unspecified drugs, medicaments and biological substances: Z88.9

## 2014-03-21 HISTORY — DX: Hyperlipidemia, unspecified: E78.5

## 2014-07-26 ENCOUNTER — Ambulatory Visit (INDEPENDENT_AMBULATORY_CARE_PROVIDER_SITE_OTHER): Payer: BLUE CROSS/BLUE SHIELD

## 2014-07-26 ENCOUNTER — Ambulatory Visit (INDEPENDENT_AMBULATORY_CARE_PROVIDER_SITE_OTHER): Payer: BLUE CROSS/BLUE SHIELD | Admitting: Sports Medicine

## 2014-07-26 ENCOUNTER — Encounter: Payer: Self-pay | Admitting: Sports Medicine

## 2014-07-26 VITALS — BP 132/83 | HR 81 | Ht 67.0 in | Wt 171.0 lb

## 2014-07-26 DIAGNOSIS — R5382 Chronic fatigue, unspecified: Secondary | ICD-10-CM | POA: Diagnosis not present

## 2014-07-26 DIAGNOSIS — E785 Hyperlipidemia, unspecified: Secondary | ICD-10-CM

## 2014-07-26 DIAGNOSIS — M170 Bilateral primary osteoarthritis of knee: Secondary | ICD-10-CM | POA: Insufficient documentation

## 2014-07-26 DIAGNOSIS — S86319S Strain of muscle(s) and tendon(s) of peroneal muscle group at lower leg level, unspecified leg, sequela: Secondary | ICD-10-CM

## 2014-07-26 DIAGNOSIS — Z982 Presence of cerebrospinal fluid drainage device: Secondary | ICD-10-CM

## 2014-07-26 DIAGNOSIS — Z0000 Encounter for general adult medical examination without abnormal findings: Secondary | ICD-10-CM

## 2014-07-26 DIAGNOSIS — R5383 Other fatigue: Secondary | ICD-10-CM | POA: Insufficient documentation

## 2014-07-26 DIAGNOSIS — S86319A Strain of muscle(s) and tendon(s) of peroneal muscle group at lower leg level, unspecified leg, initial encounter: Secondary | ICD-10-CM

## 2014-07-26 HISTORY — DX: Encounter for general adult medical examination without abnormal findings: Z00.00

## 2014-07-26 HISTORY — DX: Presence of cerebrospinal fluid drainage device: Z98.2

## 2014-07-26 HISTORY — DX: Hyperlipidemia, unspecified: E78.5

## 2014-07-26 HISTORY — DX: Bilateral primary osteoarthritis of knee: M17.0

## 2014-07-26 HISTORY — DX: Strain of muscle(s) and tendon(s) of peroneal muscle group at lower leg level, unspecified leg, initial encounter: S86.319A

## 2014-07-26 MED ORDER — MELOXICAM 15 MG PO TABS: ORAL_TABLET | ORAL | Status: DC

## 2014-07-26 NOTE — Assessment & Plan Note (Signed)
Did get myalgias with 40 mg of atorvastatin. He is going to decrease to 20 mg and continue to take at night.

## 2014-07-26 NOTE — Assessment & Plan Note (Addendum)
Postop, does have pes cavus bilaterally. Return for custom orthotics.

## 2014-07-26 NOTE — Assessment & Plan Note (Signed)
Postarthroscopy and partial meniscectomy on the right. Unloader brace on the right. Status post anterior cruciate ligament repair with reconstruction on the left. X-rays, switching to meloxicam. Return for custom orthotics. We certainly could try Visco supplementation.

## 2014-07-26 NOTE — Assessment & Plan Note (Signed)
Patient will return for complete physical.

## 2014-07-26 NOTE — Progress Notes (Signed)
  Subjective:    CC: Establish care.   HPI:  This is a very pleasant 44 year old male with a fairly complex medical history.  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  Bilateral knee osteoarthritis: Post anterior cruciate ligament repair on the left, post right-sided arthroscopy with partial meniscectomy. He currently has an unloader brace on the right side, still has pain on the right side, left side is asymptomatic.  Bilateral ankle pain: Has a bilateral Peroneus brevis tear post repair.  Hyperlipidemia: Was on 40 mg of Lipitor, started to get some shoulder aches and pains, the stopped when he discontinued it. He has not yet tried a lower dose.  Fatigue: Nonspecific, has difficulty keeping up with his children. Would like some blood work done for further investigation.  Past medical history, Surgical history, Family history not pertinant except as noted below, Social history, Allergies, and medications have been entered into the medical record, reviewed, and no changes needed.   Review of Systems: No headache, visual changes, nausea, vomiting, diarrhea, constipation, dizziness, abdominal pain, skin rash, fevers, chills, night sweats, swollen lymph nodes, weight loss, chest pain, body aches, joint swelling, muscle aches, shortness of breath, mood changes, visual or auditory hallucinations.  Objective:    General: Well Developed, well nourished, and in no acute distress.  Neuro: Alert and oriented x3, extra-ocular muscles intact, sensation grossly intact.  HEENT: Normocephalic, atraumatic, pupils equal round reactive to light, neck supple, no masses, no lymphadenopathy, thyroid nonpalpable.  Skin: Warm and dry, no rashes noted.  Cardiac: Regular rate and rhythm, no murmurs rubs or gallops.  Respiratory: Clear to auscultation bilaterally. Not using accessory muscles, speaking in  full sentences.  Abdominal: Soft, nontender, nondistended, positive bowel sounds, no masses, no organomegaly.  Musculoskeletal: Shoulder, elbow, wrist, hip, knee, ankle stable, and with full range of motion.  Impression and Recommendations:    The patient was counselled, risk factors were discussed, anticipatory guidance given.

## 2014-07-26 NOTE — Assessment & Plan Note (Signed)
Checking TSH, CBC, testosterone.

## 2014-08-02 LAB — COMPREHENSIVE METABOLIC PANEL
ALT: 28 U/L (ref 0–53)
AST: 20 U/L (ref 0–37)
CO2: 25 mEq/L (ref 19–32)
Glucose, Bld: 91 mg/dL (ref 70–99)
Sodium: 138 mEq/L (ref 135–145)
Total Bilirubin: 0.6 mg/dL (ref 0.2–1.2)

## 2014-08-02 LAB — TESTOSTERONE: Testosterone: 374 ng/dL (ref 300–890)

## 2014-08-02 LAB — COMPREHENSIVE METABOLIC PANEL WITH GFR
Albumin: 4.4 g/dL (ref 3.5–5.2)
Alkaline Phosphatase: 72 U/L (ref 39–117)
BUN: 16 mg/dL (ref 6–23)
Calcium: 9.5 mg/dL (ref 8.4–10.5)
Chloride: 103 meq/L (ref 96–112)
Creat: 0.8 mg/dL (ref 0.50–1.35)
Potassium: 4.8 meq/L (ref 3.5–5.3)
Total Protein: 6.8 g/dL (ref 6.0–8.3)

## 2014-08-02 LAB — LIPID PANEL
Cholesterol: 130 mg/dL (ref 0–200)
HDL: 38 mg/dL — ABNORMAL LOW (ref >39)
LDL Cholesterol: 70 mg/dL (ref 0–99)
Total CHOL/HDL Ratio: 3.4 ratio
Triglycerides: 108 mg/dL (ref <150)
VLDL: 22 mg/dL (ref 0–40)

## 2014-08-02 LAB — CBC
HCT: 43.7 % (ref 39.0–52.0)
Hemoglobin: 15 g/dL (ref 13.0–17.0)
MCH: 32.3 pg (ref 26.0–34.0)
MCHC: 34.3 g/dL (ref 30.0–36.0)
MCV: 94.2 fL (ref 78.0–100.0)
MPV: 10 fL (ref 8.6–12.4)
Platelets: 353 10*3/uL (ref 150–400)
RBC: 4.64 MIL/uL (ref 4.22–5.81)
RDW: 13.8 % (ref 11.5–15.5)
WBC: 10.3 K/uL (ref 4.0–10.5)

## 2014-08-02 LAB — TSH: TSH: 1.18 u[IU]/mL (ref 0.350–4.500)

## 2014-08-02 LAB — HEMOGLOBIN A1C
Hgb A1c MFr Bld: 5.7 % — ABNORMAL HIGH (ref <5.7)
Mean Plasma Glucose: 117 mg/dL — ABNORMAL HIGH (ref <117)

## 2014-08-16 ENCOUNTER — Encounter: Payer: Self-pay | Admitting: Sports Medicine

## 2014-08-26 ENCOUNTER — Encounter: Payer: Self-pay | Admitting: Sports Medicine

## 2014-08-26 ENCOUNTER — Ambulatory Visit (INDEPENDENT_AMBULATORY_CARE_PROVIDER_SITE_OTHER): Payer: BLUE CROSS/BLUE SHIELD | Admitting: Sports Medicine

## 2014-08-26 VITALS — BP 116/74 | HR 85 | Ht 67.0 in | Wt 176.0 lb

## 2014-08-26 DIAGNOSIS — M170 Bilateral primary osteoarthritis of knee: Secondary | ICD-10-CM

## 2014-08-26 DIAGNOSIS — R5383 Other fatigue: Secondary | ICD-10-CM | POA: Diagnosis not present

## 2014-08-26 NOTE — Progress Notes (Signed)

## 2014-08-26 NOTE — Assessment & Plan Note (Signed)
He will make another visit for orthotics, he does desire a second set, at the same time I would like to ask him some questions about his fatigue, to determine whether this is likely related to testosterone versus sleep apnea versus depression. Testosterone was in the low/normal range.

## 2014-08-26 NOTE — Assessment & Plan Note (Signed)
Custom orthotics as above. He will think about the low profile continue meloxicam as needed now. We will continue to keep viscous supplementation in the back of our heads as an option.

## 2014-09-15 ENCOUNTER — Ambulatory Visit (INDEPENDENT_AMBULATORY_CARE_PROVIDER_SITE_OTHER): Payer: BLUE CROSS/BLUE SHIELD | Admitting: Sports Medicine

## 2014-09-15 ENCOUNTER — Encounter: Payer: Self-pay | Admitting: Sports Medicine

## 2014-09-15 VITALS — BP 136/84 | HR 90 | Ht 67.0 in | Wt 177.0 lb

## 2014-09-15 DIAGNOSIS — F341 Dysthymic disorder: Secondary | ICD-10-CM

## 2014-09-15 DIAGNOSIS — M170 Bilateral primary osteoarthritis of knee: Secondary | ICD-10-CM | POA: Diagnosis not present

## 2014-09-15 DIAGNOSIS — S86319A Strain of muscle(s) and tendon(s) of peroneal muscle group at lower leg level, unspecified leg, initial encounter: Secondary | ICD-10-CM | POA: Diagnosis not present

## 2014-09-15 MED ORDER — DULOXETINE HCL 30 MG PO CPEP: 30.0000 mg | ORAL_CAPSULE | Freq: Every day | ORAL | Status: DC

## 2014-09-15 NOTE — Assessment & Plan Note (Signed)
Improving significantly with custom orthotics, second pain of orthotics created today.

## 2014-09-15 NOTE — Progress Notes (Signed)

## 2014-09-15 NOTE — Assessment & Plan Note (Signed)
Improving with orthotics.

## 2014-09-15 NOTE — Assessment & Plan Note (Addendum)
With an element of seasonal affective disorder. He does endorse mild depressed mood, difficulty sleeping, moderate poor energy, difficulty concentrating. Starting Cymbalta. Return in 4 weeks.

## 2014-10-06 ENCOUNTER — Emergency Department
Admission: EM | Admit: 2014-10-06 | Discharge: 2014-10-06 | Disposition: A | Discharge status: Home / Self Care | Payer: BLUE CROSS/BLUE SHIELD | Source: Home / Self Care | Attending: Emergency Medicine | Admitting: Emergency Medicine

## 2014-10-06 ENCOUNTER — Encounter: Payer: Self-pay | Admitting: Emergency Medicine

## 2014-10-06 DIAGNOSIS — J1189 Influenza due to unidentified influenza virus with other manifestations: Secondary | ICD-10-CM

## 2014-10-06 DIAGNOSIS — J111 Influenza due to unidentified influenza virus with other respiratory manifestations: Secondary | ICD-10-CM

## 2014-10-06 HISTORY — DX: Bee allergy status: Z91.030

## 2014-10-06 LAB — POCT INFLUENZA A/B
INFLUENZA A, POC: NEGATIVE
INFLUENZA B, POC: POSITIVE

## 2014-10-06 MED ORDER — EPINEPHRINE 0.3 MG/0.3ML IJ SOAJ
0.3000 mg | Freq: Once | INTRAMUSCULAR | Status: DC
Start: 2014-10-06 — End: 2016-03-19

## 2014-10-06 MED ORDER — IBUPROFEN 800 MG PO TABS: 800.0000 mg | ORAL_TABLET | Freq: Three times a day (TID) | ORAL | Status: DC

## 2014-10-06 MED ORDER — BENZONATATE 100 MG PO CAPS: 100.0000 mg | ORAL_CAPSULE | Freq: Three times a day (TID) | ORAL | Status: DC

## 2014-10-06 NOTE — ED Provider Notes (Signed)
CSN: [id number]     Arrival date & time 10/06/14  0840 History   First MD Initiated Contact with Patient 10/06/14 [phone]     Chief Complaint  Patient presents with  . Nasal Congestion   (Consider location/radiation/quality/duration/timing/severity/associated sxs/prior Treatment) Patient is a 44 y.o. male presenting with cough. The history is provided by the patient. No language interpreter was used.  Cough Cough characteristics:  Productive Sputum characteristics:  White Severity:  Moderate Onset quality:  Gradual Duration:  4 days Timing:  Constant Progression:  Worsening Chronicity:  New Smoker: no   Context: upper respiratory infection   Relieved by:  Nothing Worsened by:  Nothing tried Ineffective treatments:  None tried Associated symptoms: chills and myalgias   Pt complains of a cough and congestion.    Past Medical History  Diagnosis Date  . Pain     LEFT SHOULDER- ROTATOR CUFF  TEAR  . 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 Yellow jacket sting allergy    Past Surgical History  Procedure Laterality Date  . Right ankle tendon transfer  SEPT 30, 2013    SURGERY WAS DONE IN [facility]--PT WEARS  ORTHOTIC BOOT WHEN AMBULATING--HEALING INCISION--SOME WEAKNESS STILL  . Ventriculoperitoneal shunt    . Anterior cruciate ligament repair  1997 OR 1998    LEFT KNEE  . Right wrist fixation aug  2006    . Left ankle tendon transfer  FEB 2010  . Shoulder arthroscopy with rotator cuff repair and subacromial decompression  06/18/2012    Procedure: SHOULDER ARTHROSCOPY WITH ROTATOR CUFF REPAIR AND SUBACROMIAL DECOMPRESSION;  Surgeon: Javier DockerJeffrey C Beane, MD;  Location: [facility];  Service: Orthopedics;  Laterality: Left;  Left Shoulder arthroscopy subacromial decompression mini open rotator cuff repair    Family History  Problem Relation Age of Onset  . Diabetes Father     History  Substance Use Topics  . Smoking status: Former Smoker -- 1.00 packs/day for 20 years    Types: Cigarettes  . Smokeless tobacco: Never Used     Comment: QUIT SMOKING 2011  . Alcohol Use: No    Review of Systems  Constitutional: Positive for chills.  Respiratory: Positive for cough.   Musculoskeletal: Positive for myalgias.  All other systems reviewed and are negative.   Allergies  Review of patient's allergies indicates no known allergies.  Home Medications   Prior to Admission medications   Medication Sig Start Date End Date Taking? Authorizing Provider  aspirin EC 81 MG tablet Take 81 mg by mouth daily.    Historical Provider, MD  atorvastatin (LIPITOR) 40 MG tablet Take 0.5 tablets (20 mg total) by mouth daily. 07/26/14   Monica Bectonhomas J Thekkekandam, MD  DULoxetine (CYMBALTA) 30 MG capsule Take 1 capsule (30 mg total) by mouth daily. 09/15/14   Monica Bectonhomas J Thekkekandam, MD  EPINEPHrine (EPIPEN 2-PAK) 0.3 mg/0.3 mL DEVI Inject 0.3 mg into the muscle once. For yellow jackets    Historical Provider, MD  fish oil-omega-3 fatty acids 1000 MG capsule Take 1 g by mouth daily.    Historical Provider, MD  meloxicam (MOBIC) 15 MG tablet One tab PO qAM with breakfast for 2 weeks, then daily prn pain. 07/26/14   Monica Bectonhomas J Thekkekandam, MD  methocarbamol (ROBAXIN) 500 MG tablet Take 1 tablet (500 mg total) by mouth 3 (three) times daily. 06/18/12   Jene EveryJeffrey Beane, MD  Multiple Vitamin (  MULTIVITAMIN WITH MINERALS) TABS Take 1 tablet by mouth daily.    Historical Provider, MD   BP 103/71 mmHg  Pulse 91  Temp(Src) 98.5 F (36.9 C) (Oral)  Resp 16  Ht  (1.702 m)  Wt 170 lb (77.111 kg)  BMI 26.62 kg/m2  SpO2 99% Physical Exam  Constitutional: He appears well-developed and well-nourished.  HENT:  Head: Normocephalic.  Right Ear: External ear normal.  Left Ear: External ear normal.  Eyes: Conjunctivae are normal. Pupils are equal, round, and reactive to light.  Neck: Normal range of  motion. Neck supple.  Cardiovascular: Normal rate and normal heart sounds.   Pulmonary/Chest: Effort normal.  Abdominal: Soft.  Musculoskeletal: Normal range of motion.  Neurological: He is alert.  Skin: Skin is warm.  Psychiatric: He has a normal mood and affect.  Nursing note and vitals reviewed.   ED Course  Procedures (including critical care time) Labs Review Labs Reviewed - No data to display  Imaging Review No results found.   MDM  Influenza b positive,   Pt counseled, no benefit to tamiflu.   Pt given rx for ibuprofen and tessalon perles.  Pt advised to return if any problems.   1. Influenza with respiratory manifestations    [facility]   Elson Areas, PA-C 10/06/14 0938  Elson Areas, PA-C 10/06/14 [phone]

## 2014-10-06 NOTE — ED Notes (Signed)
Patient states has been congested with aches, ear pain, cough and sneezing; chills but not fever x 4 days.

## 2014-10-06 NOTE — Discharge Instructions (Signed)

## 2014-10-10 ENCOUNTER — Other Ambulatory Visit: Payer: Self-pay | Admitting: Sports Medicine

## 2014-10-13 ENCOUNTER — Ambulatory Visit: Payer: BLUE CROSS/BLUE SHIELD | Admitting: Sports Medicine

## 2014-10-20 ENCOUNTER — Encounter: Payer: Self-pay | Admitting: Sports Medicine

## 2014-10-20 ENCOUNTER — Ambulatory Visit (INDEPENDENT_AMBULATORY_CARE_PROVIDER_SITE_OTHER): Payer: BLUE CROSS/BLUE SHIELD | Admitting: Sports Medicine

## 2014-10-20 DIAGNOSIS — F341 Dysthymic disorder: Secondary | ICD-10-CM

## 2014-10-20 MED ORDER — CAFFEINE 200 MG PO TABS: ORAL_TABLET | ORAL | Status: DC

## 2014-10-20 NOTE — Progress Notes (Signed)
  Subjective:    CC:  Follow-up  HPI:  Frederick Ward returns, he has what seemed to be dysthymia, with predominantly fatigue, we started Cymbalta, and overall after the past month he has not had any improvement. He tells me he is sleeping significantly better and his principle issue with simple fatigue. He does well with a cup and a half of coffee per day. No suicidal or homicidal ideation. 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 Feels very well rested in the mornings.  Past medical history, Surgical history, Family history not pertinant except as noted below, Social history, Allergies, and medications have been entered into the medical record, reviewed, and no changes needed.   Review of Systems: No fevers, chills, night sweats, weight loss, chest pain, or shortness of breath.   Objective:    General: Well Developed, well nourished, and in no acute distress.  Neuro: Alert and oriented x3, extra-ocular muscles intact, sensation grossly intact.  HEENT: Normocephalic, atraumatic, pupils equal round reactive to light, neck supple, no masses, no lymphadenopathy, thyroid nonpalpable.  Skin: Warm and dry, no rashes. Cardiac: Regular rate and rhythm, no murmurs rubs or gallops, no lower extremity edema.  Respiratory: Clear to auscultation bilaterally. Not using accessory muscles, speaking in full sentences.  Impression and Recommendations:

## 2014-10-20 NOTE — Assessment & Plan Note (Signed)
Very mild fatigue, he does sleep well. He denies depressed mood at this time. Cymbalta hasn't helped so we will discontinue this. When he does drink his coffee he feels good, and he does a lot of driving, we are going to add caffeine at 250 mg per day and a tablet, if this is ineffective we can try empiric testosterone injections, his levels were approximately 300. Again, if this is ineffective we concern we try Provigil.

## 2014-11-24 ENCOUNTER — Ambulatory Visit: Payer: BLUE CROSS/BLUE SHIELD | Admitting: Sports Medicine

## 2014-12-29 ENCOUNTER — Encounter: Payer: Self-pay | Admitting: Sports Medicine

## 2014-12-29 ENCOUNTER — Telehealth: Payer: Self-pay | Admitting: Sports Medicine

## 2014-12-29 ENCOUNTER — Ambulatory Visit (INDEPENDENT_AMBULATORY_CARE_PROVIDER_SITE_OTHER): Payer: BLUE CROSS/BLUE SHIELD | Admitting: Sports Medicine

## 2014-12-29 VITALS — BP 137/91 | HR 81 | Temp 98.0°F | Ht 67.0 in | Wt 174.0 lb

## 2014-12-29 DIAGNOSIS — M170 Bilateral primary osteoarthritis of knee: Secondary | ICD-10-CM | POA: Diagnosis not present

## 2014-12-29 DIAGNOSIS — S86311A Strain of muscle(s) and tendon(s) of peroneal muscle group at lower leg level, right leg, initial encounter: Secondary | ICD-10-CM | POA: Diagnosis not present

## 2014-12-29 DIAGNOSIS — R5383 Other fatigue: Secondary | ICD-10-CM | POA: Diagnosis not present

## 2014-12-29 NOTE — Telephone Encounter (Signed)
-----   Message from Monica Becton, MD sent at 12/29/2014  3:21 PM EDT ----- Tor Netters Shakes,  Left get this patient approved for Orthovisc on the right knee. He has x-ray confirmed osteoarthritis that has failed oral medications.

## 2014-12-29 NOTE — Assessment & Plan Note (Signed)
Right worse than left x-ray confirmed osteoarthritis. Next line we are going to get Loistine Chance approved for Orthovisc injections. When approved he can return, we are simply going to do the right knee for now.

## 2014-12-29 NOTE — Assessment & Plan Note (Signed)
History of his brevis tendon repair 2, right side is hurting, with clinically referable to the peroneal sheath. We are going to obtain a new MRI, he will need to have his ventriculoperitoneal shunt reset immediately after the MRI so there will be some complex scheduling here. Return to see me after the MRI and we can consider peroneal tendon sheath injection.

## 2014-12-29 NOTE — Assessment & Plan Note (Signed)
Very mild, difficulty sleeping, no depressed mood. This is a combination of simple, normal fatigue, but also dehydration. He has done well with caffeine 250 mg tablets per day. Certainly if no improvement in the future we can try. Testosterone injections, his levels were 300, Provigil and Nuvigil also remain options in the future

## 2014-12-29 NOTE — Telephone Encounter (Signed)
Information submitted to Orthovisc for approval.  

## 2014-12-29 NOTE — Progress Notes (Signed)
  Subjective:    CC: Follow-up  HPI: Fatigue: Resolved well with caffeine tablets. He does work in the cheek every day, and does not seem to hydrate himself appropriately.  Bilateral knee osteoarthritis: Amenable to start with viscous supplementation, left knee is okay, right knee continues to have pain at the medial joint line.  Right ankle pain: Post peroneus brevis repair, now having a recurrence of pain one year post surgery. He would like an MRI before considering any further intervention. Unfortunately he does have a ventriculoperitoneal shunt and needs to be scheduled with his neurosurgeon right after his MRI, apparently the shunt needs to be readjusted after any MRI procedure. He will set up the appointment with his neurosurgeon on the day of his MRI.  Past medical history, Surgical history, Family history not pertinant except as noted below, Social history, Allergies, and medications have been entered into the medical record, reviewed, and no changes needed.   Review of Systems: No fevers, chills, night sweats, weight loss, chest pain, or shortness of breath.   Objective:    General: Well Developed, well nourished, and in no acute distress.  Neuro: Alert and oriented x3, extra-ocular muscles intact, sensation grossly intact.  HEENT: Normocephalic, atraumatic, pupils equal round reactive to light, neck supple, no masses, no lymphadenopathy, thyroid nonpalpable.  Skin: Warm and dry, no rashes. Cardiac: Regular rate and rhythm, no murmurs rubs or gallops, no lower extremity edema.  Respiratory: Clear to auscultation bilaterally. Not using accessory muscles, speaking in full sentences. Right Knee: Normal to inspection with no erythema or effusion or obvious bony abnormalities. Palpation normal with no warmth or joint line tenderness or patellar tenderness or condyle tenderness. ROM normal in flexion and extension and lower leg rotation. Ligaments with solid consistent endpoints  including ACL, PCL, LCL, MCL. Negative Mcmurray's and provocative meniscal tests. Non painful patellar compression. Patellar and quadriceps tendons unremarkable. Hamstring and quadriceps strength is normal. Right Ankle: No visible erythema or swelling. Range of motion is full in all directions. Strength is 5/5 in all directions. Stable lateral and medial ligaments; squeeze test and kleiger test unremarkable; Talar dome nontender; No pain at base of 5th MT; No tenderness over cuboid; No tenderness over N spot or navicular prominence Tender to palpation behind the lateral malleolus with reproduction of pain on resisted eversion of the ankle. No sign of peroneal tendon subluxations; Negative tarsal tunnel tinel's Able to walk 4 steps.  Impression and Recommendations:

## 2014-12-30 NOTE — Telephone Encounter (Signed)
Orthovisc approved. Required predetermination, called and spoke with [name]. Authorization #: [id number]. Valid dates: 12/30/14-end of year. Pt notified. Will schedule his appts after him MRI is completed.

## 2014-12-31 ENCOUNTER — Other Ambulatory Visit: Payer: Self-pay | Admitting: Sports Medicine

## 2015-01-23 ENCOUNTER — Ambulatory Visit (INDEPENDENT_AMBULATORY_CARE_PROVIDER_SITE_OTHER): Payer: BLUE CROSS/BLUE SHIELD

## 2015-01-23 DIAGNOSIS — X58XXXA Exposure to other specified factors, initial encounter: Secondary | ICD-10-CM

## 2015-01-23 DIAGNOSIS — S96811A Strain of other specified muscles and tendons at ankle and foot level, right foot, initial encounter: Secondary | ICD-10-CM | POA: Diagnosis not present

## 2015-01-23 DIAGNOSIS — M7671 Peroneal tendinitis, right leg: Secondary | ICD-10-CM

## 2015-01-25 ENCOUNTER — Encounter: Payer: Self-pay | Admitting: Sports Medicine

## 2015-01-25 ENCOUNTER — Ambulatory Visit (INDEPENDENT_AMBULATORY_CARE_PROVIDER_SITE_OTHER): Payer: BLUE CROSS/BLUE SHIELD | Admitting: Sports Medicine

## 2015-01-25 VITALS — BP 134/81 | HR 77 | Ht 67.0 in | Wt 174.0 lb

## 2015-01-25 DIAGNOSIS — S86311A Strain of muscle(s) and tendon(s) of peroneal muscle group at lower leg level, right leg, initial encounter: Secondary | ICD-10-CM | POA: Diagnosis not present

## 2015-01-25 DIAGNOSIS — M170 Bilateral primary osteoarthritis of knee: Secondary | ICD-10-CM | POA: Diagnosis not present

## 2015-01-25 MED ORDER — NITROGLYCERIN 0.2 MG/HR TD PT24: MEDICATED_PATCH | TRANSDERMAL | Status: DC

## 2015-01-25 NOTE — Assessment & Plan Note (Signed)
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 He does need a tendon sheath injection followed by approximately 1 month of mobilization. This will be followed by rehabilitation. His work will not allow this for now so we will simply do a nitroglycerin patch, as well as an Aircast. Return to see me in one month for this.

## 2015-01-25 NOTE — Progress Notes (Signed)
  Subjective:    CC: follow-up  HPI: Knee osteoarthritis: Left knee is doing okay, here for Orthovisc injection #1 into the right knee  Right ankle pain: History of peroneal tendon injuries, failed conservative measures, more recently we obtained an MRI, the results of which be dictated below, pain is moderate, persistent and localized behind the lateral malleolus  Past medical history, Surgical history, Family history not pertinant except as noted below, Social history, Allergies, and medications have been entered into the medical record, reviewed, and no changes needed.   Review of Systems: No fevers, chills, night sweats, weight loss, chest pain, or shortness of breath.   Objective:    General: Well Developed, well nourished, and in no acute distress.  Neuro: Alert and oriented x3, extra-ocular muscles intact, sensation grossly intact.  HEENT: Normocephalic, atraumatic, pupils equal round reactive to light, neck supple, no masses, no lymphadenopathy, thyroid nonpalpable.  Skin: Warm and dry, no rashes. Cardiac: Regular rate and rhythm, no murmurs rubs or gallops, no lower extremity edema.  Respiratory: Clear to auscultation bilaterally. Not using accessory muscles, speaking in full sentences.  Procedure: Real-time Ultrasound Guided Injection of right knee Device: GE Logiq E  Verbal informed consent obtained.  Time-out conducted.  Noted no overlying erythema, induration, or other signs of local infection.  Skin prepped in a sterile fashion.  Local anesthesia: Topical Ethyl chloride.  With sterile technique and under real time ultrasound guidance:   30 mg/2 mL of OrthoVisc (sodium hyaluronate) in a prefilled syringe was injected easily into the knee through a 22-gauge needle. Completed without difficulty  Pain immediately resolved suggesting accurate placement of the medication.  Advised to call if fevers/chills, erythema, induration, drainage, or persistent bleeding.  Images  permanently stored and available for review in the ultrasound unit.  Impression: Technically successful ultrasound guided injection.  MRI shows a split tear the peroneus longus as well as tendinosis and thickening of peroneus brevis  Impression and Recommendations:    I spent 40 minutes with this patient, greater than 50% was face-to-face time counseling regarding the above diagnoses

## 2015-01-25 NOTE — Assessment & Plan Note (Signed)
Orthovisc injection of her 1 of 4 into the right knee. Return in one week for #2 of 4

## 2015-02-01 ENCOUNTER — Ambulatory Visit: Payer: BLUE CROSS/BLUE SHIELD | Admitting: Sports Medicine

## 2015-02-02 ENCOUNTER — Ambulatory Visit (INDEPENDENT_AMBULATORY_CARE_PROVIDER_SITE_OTHER): Payer: BLUE CROSS/BLUE SHIELD | Admitting: Sports Medicine

## 2015-02-02 ENCOUNTER — Encounter: Payer: Self-pay | Admitting: Sports Medicine

## 2015-02-02 VITALS — BP 138/84 | HR 79 | Ht 67.0 in | Wt 178.0 lb

## 2015-02-02 DIAGNOSIS — S86311S Strain of muscle(s) and tendon(s) of peroneal muscle group at lower leg level, right leg, sequela: Secondary | ICD-10-CM | POA: Diagnosis not present

## 2015-02-02 DIAGNOSIS — M170 Bilateral primary osteoarthritis of knee: Secondary | ICD-10-CM

## 2015-02-02 NOTE — Progress Notes (Signed)
  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 Surgeon which is okay.  Knee osteoarthritis: Due for Orthovisc injection #2 on the right side, already feeling a bit better.  Past medical history, Surgical history, Family history not pertinant except as noted below, Social history, Allergies, and medications have been entered into the medical record, reviewed, and no changes needed.   Review of Systems: No fevers, chills, night sweats, weight loss, chest pain, or shortness of breath.   Objective:    General: Well Developed, well nourished, and in no acute distress.  Neuro: Alert and oriented x3, extra-ocular muscles intact, sensation grossly intact.  HEENT: Normocephalic, atraumatic, pupils equal round reactive to light, neck supple, no masses, no lymphadenopathy, thyroid nonpalpable.  Skin: Warm and dry, no rashes. Cardiac: Regular rate and rhythm, no murmurs rubs or gallops, no lower extremity edema.  Respiratory: Clear to auscultation bilaterally. Not using accessory muscles, speaking in full sentences.  Procedure: Real-time Ultrasound Guided Injection of right knee Device: GE Logiq E  Verbal informed consent obtained.  Time-out conducted.  Noted no overlying erythema, induration, or other signs of local infection.  Skin prepped in a sterile fashion.  Local anesthesia: Topical Ethyl chloride.  With sterile technique and under real time ultrasound guidance:   30 mg/2 mL of OrthoVisc (sodium hyaluronate) in a prefilled syringe was injected easily into the knee through a 22-gauge needle. Completed without difficulty  Pain immediately resolved suggesting accurate placement of the medication.  Advised to call if fevers/chills, erythema, induration, drainage, or persistent bleeding.  Images permanently stored and available for review in  the ultrasound unit.  Impression: Technically successful ultrasound guided injection. Impression and Recommendations:

## 2015-02-02 NOTE — Assessment & Plan Note (Signed)
Orthovisc injection #2 of 4 into the right knee. Return in one week for # 3

## 2015-02-02 NOTE — Assessment & Plan Note (Signed)
Rather than proceed with injection for peroneus brevis tendinosis and a longitudinal peroneus longus split tear we are going to proceed with a visit with foot and ankle surgery. Return as needed for this.

## 2015-02-03 ENCOUNTER — Ambulatory Visit: Payer: BLUE CROSS/BLUE SHIELD | Admitting: Sports Medicine

## 2015-02-09 ENCOUNTER — Ambulatory Visit (INDEPENDENT_AMBULATORY_CARE_PROVIDER_SITE_OTHER): Payer: BLUE CROSS/BLUE SHIELD | Admitting: Sports Medicine

## 2015-02-09 ENCOUNTER — Encounter: Payer: Self-pay | Admitting: Sports Medicine

## 2015-02-09 VITALS — BP 136/86 | HR 83 | Wt 177.0 lb

## 2015-02-09 DIAGNOSIS — M170 Bilateral primary osteoarthritis of knee: Secondary | ICD-10-CM | POA: Diagnosis not present

## 2015-02-09 NOTE — Progress Notes (Signed)

## 2015-02-09 NOTE — Assessment & Plan Note (Signed)
Right knee injection #3. Return in one week for #4.

## 2015-02-15 ENCOUNTER — Telehealth: Payer: Self-pay

## 2015-02-15 NOTE — Telephone Encounter (Signed)
PATIENT CALLED REQUESTED A REFERRAL FOR ORTHOPAEDIC SURGEON PREFERABLE A ANKLE SURGEON. THE REASON IS HIS TENDONS IN HIS ANKLE IS GETTING WORST. Rhonda Cunningham,CMA

## 2015-02-16 ENCOUNTER — Encounter: Payer: Self-pay | Admitting: Sports Medicine

## 2015-02-16 ENCOUNTER — Ambulatory Visit (INDEPENDENT_AMBULATORY_CARE_PROVIDER_SITE_OTHER): Payer: BLUE CROSS/BLUE SHIELD | Admitting: Sports Medicine

## 2015-02-16 VITALS — BP 134/77 | HR 59 | Ht 67.0 in | Wt 180.0 lb

## 2015-02-16 DIAGNOSIS — M170 Bilateral primary osteoarthritis of knee: Secondary | ICD-10-CM | POA: Diagnosis not present

## 2015-02-16 DIAGNOSIS — S86311S Strain of muscle(s) and tendon(s) of peroneal muscle group at lower leg level, right leg, sequela: Secondary | ICD-10-CM

## 2015-02-16 NOTE — Progress Notes (Signed)
  Subjective:    CC: follow-up  HPI: Knee osteoarthritis: Right-sided, overall good improvement after 3 Orthovisc injections, here for #4.  Peroneal tendon tear: Has been operated on before, desires referral to foot and ankle surgery  Past medical history, Surgical history, Family history not pertinant except as noted below, Social history, Allergies, and medications have been entered into the medical record, reviewed, and no changes needed.   Review of Systems: No fevers, chills, night sweats, weight loss, chest pain, or shortness of breath.   Objective:    General: Well Developed, well nourished, and in no acute distress.  Neuro: Alert and oriented x3, extra-ocular muscles intact, sensation grossly intact.  HEENT: Normocephalic, atraumatic, pupils equal round reactive to light, neck supple, no masses, no lymphadenopathy, thyroid nonpalpable.  Skin: Warm and dry, no rashes. Cardiac: Regular rate and rhythm, no murmurs rubs or gallops, no lower extremity edema.  Respiratory: Clear to auscultation bilaterally. Not using accessory muscles, speaking in full sentences.  Procedure: Real-time Ultrasound Guided Injection of right knee Device: GE Logiq E  Verbal informed consent obtained.  Time-out conducted.  Noted no overlying erythema, induration, or other signs of local infection.  Skin prepped in a sterile fashion.  Local anesthesia: Topical Ethyl chloride.  With sterile technique and under real time ultrasound guidance:   30 mg/2 mL of OrthoVisc (sodium hyaluronate) in a prefilled syringe was injected easily into the knee through a 22-gauge needle. Completed without difficulty  Pain immediately resolved suggesting accurate placement of the medication.  Advised to call if fevers/chills, erythema, induration, drainage, or persistent bleeding.  Images permanently stored and available for review in the ultrasound unit.  Impression: Technically successful ultrasound guided  injection.  Impression and Recommendations:

## 2015-02-16 NOTE — Assessment & Plan Note (Signed)
OrthoVisc Injection number 4 of 4, right knee. Return in 1 month.

## 2015-02-16 NOTE — Assessment & Plan Note (Signed)
Referral to Dr. Victorino Dike.

## 2015-02-17 NOTE — Telephone Encounter (Signed)
Done

## 2015-03-17 ENCOUNTER — Encounter: Payer: Self-pay | Admitting: Sports Medicine

## 2015-03-17 ENCOUNTER — Ambulatory Visit (INDEPENDENT_AMBULATORY_CARE_PROVIDER_SITE_OTHER): Payer: BLUE CROSS/BLUE SHIELD | Admitting: Sports Medicine

## 2015-03-17 VITALS — BP 120/81 | HR 80 | Wt 179.0 lb

## 2015-03-17 DIAGNOSIS — M170 Bilateral primary osteoarthritis of knee: Secondary | ICD-10-CM

## 2015-03-17 DIAGNOSIS — S86311S Strain of muscle(s) and tendon(s) of peroneal muscle group at lower leg level, right leg, sequela: Secondary | ICD-10-CM

## 2015-03-17 MED ORDER — TRAMADOL HCL 50 MG PO TABS: ORAL_TABLET | ORAL | Status: DC

## 2015-03-17 NOTE — Progress Notes (Signed)
  Subjective:    CC: Follow-up  HPI: Right knee osteophytes: Post Orthovisc series, 20% better however when he does take meloxicam, and uses his unloader brace he is essentially pain-free, amenable to try the tramadol. Happy with how things are going so far. All mechanical symptoms have resolved.  Peroneal brevis split tear: Perpneus brevis tendon repair planned for a few weeks from now, he is also going to undergo a dorsal first metatarsal osteotomy. He understands that he will need a new set of custom orthotics after the osteotomy.  Past medical history, Surgical history, Family history not pertinant except as noted below, Social history, Allergies, and medications have been entered into the medical record, reviewed, and no changes needed.   Review of Systems: No fevers, chills, night sweats, weight loss, chest pain, or shortness of breath.   Objective:    General: Well Developed, well nourished, and in no acute distress.  Neuro: Alert and oriented x3, extra-ocular muscles intact, sensation grossly intact.  HEENT: Normocephalic, atraumatic, pupils equal round reactive to light, neck supple, no masses, no lymphadenopathy, thyroid nonpalpable.  Skin: Warm and dry, no rashes. Cardiac: Regular rate and rhythm, no murmurs rubs or gallops, no lower extremity edema.  Respiratory: Clear to auscultation bilaterally. Not using accessory muscles, speaking in full sentences.  Impression and Recommendations:

## 2015-03-17 NOTE — Assessment & Plan Note (Signed)
Now set up for peroneus brevis split tear repair with Dr. Victorino Dike, as well as first MTP dorsal osteotomy. He will need a new set of orthotics after heals from the osteotomy.

## 2015-03-17 NOTE — Assessment & Plan Note (Signed)
At this point we have finished Orthovisc 4 of 4 in the right knee, 20% improvement from the Orthovisc, but when combined with meloxicam in the unloader brace he is happy with how the pain is.  I am going to add a bit of tramadol to use for breakthrough pain, and I can see him back on an as-needed basis for this. Next line

## 2015-03-23 ENCOUNTER — Telehealth: Payer: Self-pay | Admitting: Sports Medicine

## 2015-03-23 NOTE — Telephone Encounter (Signed)
Surgical center called, they do not need an ekg or cardiac stress test to be completed just need the last office note. This information was faxed over.

## 2015-03-23 NOTE — Telephone Encounter (Signed)
Preesh Boogs. 

## 2015-03-23 NOTE — Telephone Encounter (Signed)
Received fax from [facility] requesting the following for anesthesia review: Physical exam, ekg, cardiac stress test results, and VP Shunt info.  Called and spoke with Pt: Upcoming surgery on Rt ankle: 03/28/15 @ 0745. VP shunt: placed in 08/2007 at [facility], Surgeon: Daleen Bo.  Will route to PCP for review.

## 2015-05-06 ENCOUNTER — Other Ambulatory Visit: Payer: Self-pay | Admitting: Sports Medicine

## 2015-06-19 ENCOUNTER — Encounter: Payer: Self-pay | Admitting: Sports Medicine

## 2015-06-19 ENCOUNTER — Ambulatory Visit (INDEPENDENT_AMBULATORY_CARE_PROVIDER_SITE_OTHER): Payer: BLUE CROSS/BLUE SHIELD | Admitting: Sports Medicine

## 2015-06-19 VITALS — BP 144/89 | HR 88 | Wt 176.0 lb

## 2015-06-19 DIAGNOSIS — S86311S Strain of muscle(s) and tendon(s) of peroneal muscle group at lower leg level, right leg, sequela: Secondary | ICD-10-CM | POA: Diagnosis not present

## 2015-06-19 DIAGNOSIS — E785 Hyperlipidemia, unspecified: Secondary | ICD-10-CM

## 2015-06-19 MED ORDER — ATORVASTATIN CALCIUM 20 MG PO TABS: 20.0000 mg | ORAL_TABLET | Freq: Every day | ORAL | Status: DC

## 2015-06-19 NOTE — Assessment & Plan Note (Signed)
Post right peroneus brevis tendon repair with first MTP osteotomy. Custom orthotics as above, return for a second pair.

## 2015-06-19 NOTE — Assessment & Plan Note (Signed)
Refilling atorvastatin 20 mg daily.

## 2015-06-19 NOTE — Progress Notes (Signed)

## 2015-06-26 ENCOUNTER — Encounter: Payer: Self-pay | Admitting: Sports Medicine

## 2015-06-26 ENCOUNTER — Ambulatory Visit (INDEPENDENT_AMBULATORY_CARE_PROVIDER_SITE_OTHER): Payer: BLUE CROSS/BLUE SHIELD | Admitting: Sports Medicine

## 2015-06-26 VITALS — BP 120/84 | HR 87 | Temp 98.2°F | Resp 18 | Wt 171.0 lb

## 2015-06-26 DIAGNOSIS — S86311S Strain of muscle(s) and tendon(s) of peroneal muscle group at lower leg level, right leg, sequela: Secondary | ICD-10-CM

## 2015-06-26 NOTE — Progress Notes (Signed)

## 2015-06-26 NOTE — Assessment & Plan Note (Signed)
New set of custom orthotics as above. 

## 2015-10-10 ENCOUNTER — Other Ambulatory Visit: Payer: Self-pay | Admitting: Sports Medicine

## 2015-11-20 DIAGNOSIS — M7671 Peroneal tendinitis, right leg: Secondary | ICD-10-CM | POA: Diagnosis not present

## 2015-11-20 DIAGNOSIS — Q661 Congenital talipes calcaneovarus: Secondary | ICD-10-CM | POA: Diagnosis not present

## 2015-11-20 DIAGNOSIS — M79671 Pain in right foot: Secondary | ICD-10-CM | POA: Diagnosis not present

## 2015-11-28 DIAGNOSIS — T8484XA Pain due to internal orthopedic prosthetic devices, implants and grafts, initial encounter: Secondary | ICD-10-CM | POA: Diagnosis not present

## 2016-03-19 ENCOUNTER — Encounter: Payer: Self-pay | Admitting: Sports Medicine

## 2016-03-19 ENCOUNTER — Ambulatory Visit (INDEPENDENT_AMBULATORY_CARE_PROVIDER_SITE_OTHER): Payer: BLUE CROSS/BLUE SHIELD | Admitting: Sports Medicine

## 2016-03-19 DIAGNOSIS — Z0000 Encounter for general adult medical examination without abnormal findings: Secondary | ICD-10-CM | POA: Diagnosis not present

## 2016-03-19 DIAGNOSIS — E785 Hyperlipidemia, unspecified: Secondary | ICD-10-CM | POA: Diagnosis not present

## 2016-03-19 DIAGNOSIS — N139 Obstructive and reflux uropathy, unspecified: Secondary | ICD-10-CM

## 2016-03-19 DIAGNOSIS — S86311S Strain of muscle(s) and tendon(s) of peroneal muscle group at lower leg level, right leg, sequela: Secondary | ICD-10-CM | POA: Diagnosis not present

## 2016-03-19 HISTORY — DX: Obstructive and reflux uropathy, unspecified: N13.9

## 2016-03-19 MED ORDER — EPINEPHRINE 0.3 MG/0.3ML IJ SOAJ: 0.3000 mg | Freq: Once | INTRAMUSCULAR | 11 refills | Status: DC

## 2016-03-19 MED ORDER — TAMSULOSIN HCL 0.4 MG PO CAPS: 0.4000 mg | ORAL_CAPSULE | Freq: Every day | ORAL | 3 refills | Status: DC

## 2016-03-19 MED ORDER — MELOXICAM 15 MG PO TABS: ORAL_TABLET | ORAL | 3 refills | Status: DC

## 2016-03-19 NOTE — Assessment & Plan Note (Signed)
Routine HIV screening

## 2016-03-19 NOTE — Assessment & Plan Note (Signed)
Did well after surgery, was mowing the grass recently and has a recurrence of pain. Adding a lateral heel wedge, he did run out of meloxicam so refilling this. We will treat this conservatively for one month before considering repeat injection.

## 2016-03-19 NOTE — Patient Instructions (Signed)
Benign Prostatic Hypertrophy The prostate gland is part of the reproductive system of men. A normal prostate is about the size and shape of a walnut. The prostate gland produces a fluid that is mixed with sperm to make semen. This gland surrounds the urethra and is located in front of the rectum and just below the bladder. The bladder is where urine is stored. The urethra is the tube through which urine passes from the bladder to get out of the body. The prostate grows as a man ages. An enlarged prostate not caused by cancer is called benign prostatic hypertrophy (BPH). An enlarged prostate can press on the urethra. This can make it harder to pass urine. In the early stages of enlargement, the bladder can get by with a narrowed urethra by forcing the urine through. If the problem gets worse, medical or surgical treatment may be required.  This condition should be followed by your health care provider. The accumulation of urine in the bladder can cause infection. Back pressure and infection can progress to bladder damage and kidney (renal) failure. If needed, your health care provider may refer you to a specialist in kidney and prostate disease (urologist). CAUSES  BPH is a common health problem in men older than 50 years. This condition is a normal part of aging. However, not all men will develop problems from this condition. If the enlargement grows away from the urethra, then there will not be any compression of the urethra and resistance to urine flow.If the growth is toward the urethra and compresses it, you will experience difficulty urinating.  SYMPTOMS   Not able to completely empty your bladder.  Getting up often during the night to urinate.  Need to urinate frequently during the day.  Difficultly starting urine flow.  Decrease in size and strength of your urine stream.  Dribbling after urination.  Pain on urination (more common with infection).  Inability to pass urine. This needs  immediate treatment.  The development of a urinary tract infection. DIAGNOSIS  These tests will help your health care provider understand your problem:  A thorough history and physical examination.  A urination history, with the number of times you urinate, the amounts of urine, the strength of the urine stream, and the feeling of emptiness or fullness after urinating.  A postvoid bladder scan that measures any amount of urine that may remain in your bladder after you finish urinating.  Digital rectal exam. In a rectal exam, your health care provider checks your prostate by putting a gloved, lubricated finger into your rectum to feel the back of your prostate gland. This exam detects the size of your gland and abnormal lumps or growths.  Exam of your urine (urinalysis).  Prostate specific antigen (PSA) screening. This is a blood test used to screen for prostate cancer.  Rectal ultrasonography. This test uses sound waves to electronically produce a picture of your prostate gland. TREATMENT  Once symptoms begin, your health care provider will monitor your condition. Of the men with this condition, one third will have symptoms that stabilize, one third will have symptoms that improve, and one third will have symptoms that progress in the first year. Mild symptoms may not need treatment. Simple observation and yearly exams may be all that is required. Medicines and surgery are options for more severe problems. Your health care provider can help you make an informed decision for what is best. Two classes of medicines are available for relief of prostate symptoms:  Medicines   that shrink the prostate. This helps relieve symptoms. These medicines take time to work, and it may be months before any improvement is seen.  Uncommon side effects include problems with sexual function.  Medicines to relax the muscle of the prostate. This also relieves the obstruction by reducing any compression on the  urethra.This group of medicines work much faster than those that reduce the size of the prostate gland. Usually, one can experience improvement in days to weeks..  Side effects can include dizziness, fatigue, lightheadedness, and retrograde ejaculation (diminished volume of ejaculate). Several types of surgical treatments are available for relief of prostate symptoms:  Transurethral resection of the prostate (TURP)--In this treatment, an instrument is inserted through opening at the tip of the penis. It is used to cut away pieces of the inner core of the prostate. The pieces are removed through the same opening of the penis. This removes the obstruction and helps get rid of the symptoms.  Transurethral incision (TUIP)--In this procedure, small cuts are made in the prostate. This lessens the prostates pressure on the urethra.  Transurethral microwave thermotherapy (TUMT)--This procedure uses microwaves to create heat. The heat destroys and removes a small amount of prostate tissue.  Transurethral needle ablation (TUNA)--This is a procedure that uses radio frequencies to do the same as TUMT.  Interstitial laser coagulation (ILC)--This is a procedure that uses a laser to do the same as TUMT and TUNA.  Transurethral electrovaporization (TUVP)--This is a procedure that uses electrodes to do the same as the procedures listed above. SEEK MEDICAL CARE IF:   You develop a fever.  There is unexplained back pain.  Symptoms are not helped by medicines prescribed.  You develop side effects from the medicine you are taking.  Your urine becomes very dark or has a bad smell.  Your lower abdomen becomes distended and you have difficulty passing your urine. SEEK IMMEDIATE MEDICAL CARE IF:   You are suddenly unable to urinate. This is an emergency. You should be seen immediately.  There are large amounts of blood or clots in the urine.  Your urinary problems become unmanageable.  You develop  lightheadedness, severe dizziness, or you feel faint.  You develop moderate to severe low back or flank pain.  You develop chills or fever.   This information is not intended to replace advice given to you by your health care provider. Make sure you discuss any questions you have with your health care provider.   Document Released: 07/01/2005 Document Revised: 07/06/2013 Document Reviewed: 01/14/2013 Elsevier Interactive Patient Education 2016 [employer]  

## 2016-03-19 NOTE — Progress Notes (Signed)
  Subjective:    CC: R ankle pain and urinary hesitancy/urgency  HPI:  45 yo with history of peroneal brevis tear s/p repair presenting today with three weeks of pain behind his lateral malleolus.  Pt says pain is a constant throbbing, similar to past peroneal brevis pain.  Pain does not radiate.  He says pain began three weeks ago when mowing his lawn.  He denies twisting his ankle or any recent trauma.  He has tried icing and epson salt, as well as NSAIDs - all of which have helped a little, however, he continues to have pain.  He continues to wear his orthotics.    He is also complaining of two years of urinary hesitancy and urgency, which has gotten worse in the past few months.  Patient says he feels the need to use the restroom every 20-30 minutes but has trouble initiating a stream and frequently dribbles/has low-stream flow.  Patient says these symptoms are bothersome.  He also endorses nocturia and gets up to use the restroom 1-2 times per night.  He denies any dysuria or abnormal discharge.  He does state he drinks a lot of fluids throughout the day, but tries to cut down on fluid intake before bedtime.    Past medical history, Surgical history, Family history not pertinant except as noted below, Social history, Allergies, and medications have been entered into the medical record, reviewed, and no changes needed.   Review of Systems: No fevers, chills, night sweats, weight loss, chest pain, or shortness of breath.   Objective:    General: Well Developed, well nourished, and in no acute distress.  Neuro: Alert and oriented x3, extra-ocular muscles intact, sensation grossly intact.  HEENT: Normocephalic, atraumatic, pupils equal round reactive to light, neck supple, no masses, no lymphadenopathy, thyroid nonpalpable.  Skin: Warm and dry, no rashes. Cardiac: Regular rate and rhythm, no murmurs rubs or gallops, no lower extremity edema.  Respiratory: Clear to auscultation bilaterally. Not  using accessory muscles, speaking in full sentences. R Ankle: No visible erythema or swelling. Range of motion is full in all directions. Strength is 5/5 in all directions. Stable lateral and medial ligaments; squeeze test and kleiger test unremarkable; Talar dome nontender; Mild TTP at base of 5th MT; No tenderness over cuboid; No tenderness over N spot or navicular prominence No tenderness on posterior aspects of medial malleolus Tenderness on posterior aspect of lateral malleolus No sign of peroneal tendon subluxations or tenderness to palpation Negative tarsal tunnel tinel's Able to walk 4 steps.    Impression and Recommendations:   1. R peroneal brevis pain  -lateral heel wedge for 1 week -Restart meloxicam   2. BPH  -prescribing Flomax -checking PSA  3. Health maintenance -routine HIV screen, bloodwork

## 2016-03-19 NOTE — Assessment & Plan Note (Signed)
Rechecking blood work. 

## 2016-03-19 NOTE — Assessment & Plan Note (Signed)
Starting Flomax, checking PSA.

## 2016-04-08 DIAGNOSIS — E785 Hyperlipidemia, unspecified: Secondary | ICD-10-CM | POA: Diagnosis not present

## 2016-04-08 DIAGNOSIS — Z0000 Encounter for general adult medical examination without abnormal findings: Secondary | ICD-10-CM | POA: Diagnosis not present

## 2016-04-08 LAB — CBC
HCT: 43.8 % (ref 38.5–50.0)
Hemoglobin: 15 g/dL (ref 13.2–17.1)
MCH: 32.4 pg (ref 27.0–33.0)
MCHC: 34.2 g/dL (ref 32.0–36.0)
MCV: 94.6 fL (ref 80.0–100.0)
MPV: 10.9 fL (ref 7.5–12.5)
Platelets: 360 K/uL (ref 140–400)
RBC: 4.63 MIL/uL (ref 4.20–5.80)
RDW: 13.3 % (ref 11.0–15.0)
WBC: 13.1 10*3/uL — ABNORMAL HIGH (ref 3.8–10.8)

## 2016-04-09 LAB — TSH: TSH: 1.08 mIU/L (ref 0.40–4.50)

## 2016-04-09 LAB — HEMOGLOBIN A1C
Hgb A1c MFr Bld: 5.4 % (ref <5.7)
Mean Plasma Glucose: 108 mg/dL

## 2016-04-09 LAB — VITAMIN D 25 HYDROXY (VIT D DEFICIENCY, FRACTURES): Vit D, 25-Hydroxy: 37 ng/mL (ref 30–100)

## 2016-04-09 LAB — COMPREHENSIVE METABOLIC PANEL WITH GFR
BUN: 13 mg/dL (ref 7–25)
CO2: 26 mmol/L (ref 20–31)
Calcium: 9.7 mg/dL (ref 8.6–10.3)
Chloride: 105 mmol/L (ref 98–110)
Glucose, Bld: 93 mg/dL (ref 65–99)
Potassium: 4.5 mmol/L (ref 3.5–5.3)

## 2016-04-09 LAB — HIV ANTIBODY (ROUTINE TESTING W REFLEX): HIV 1&2 Ab, 4th Generation: NONREACTIVE

## 2016-04-09 LAB — COMPREHENSIVE METABOLIC PANEL
ALT: 35 U/L (ref 9–46)
AST: 23 U/L (ref 10–40)
Albumin: 4.3 g/dL (ref 3.6–5.1)
Alkaline Phosphatase: 70 U/L (ref 40–115)
Creat: 0.78 mg/dL (ref 0.60–1.35)
Sodium: 138 mmol/L (ref 135–146)
Total Bilirubin: 0.5 mg/dL (ref 0.2–1.2)
Total Protein: 6.8 g/dL (ref 6.1–8.1)

## 2016-04-09 LAB — PSA, TOTAL AND FREE
PSA, % Free: 50 % (ref >25)
PSA, Free: 0.1 ng/mL
PSA, Total: 0.2 ng/mL (ref <=4.0)

## 2016-04-09 LAB — LIPID PANEL
Cholesterol: 119 mg/dL — ABNORMAL LOW (ref 125–200)
HDL: 34 mg/dL — ABNORMAL LOW (ref >=40)
LDL Cholesterol: 66 mg/dL (ref <130)
Total CHOL/HDL Ratio: 3.5 Ratio (ref <=5.0)
Triglycerides: 94 mg/dL (ref <150)
VLDL: 19 mg/dL (ref <30)

## 2016-04-11 ENCOUNTER — Ambulatory Visit (INDEPENDENT_AMBULATORY_CARE_PROVIDER_SITE_OTHER): Payer: BLUE CROSS/BLUE SHIELD | Admitting: Sports Medicine

## 2016-04-11 ENCOUNTER — Ambulatory Visit (INDEPENDENT_AMBULATORY_CARE_PROVIDER_SITE_OTHER): Payer: BLUE CROSS/BLUE SHIELD

## 2016-04-11 ENCOUNTER — Encounter: Payer: Self-pay | Admitting: Sports Medicine

## 2016-04-11 DIAGNOSIS — M51369 Other intervertebral disc degeneration, lumbar region without mention of lumbar back pain or lower extremity pain: Secondary | ICD-10-CM

## 2016-04-11 DIAGNOSIS — N139 Obstructive and reflux uropathy, unspecified: Secondary | ICD-10-CM

## 2016-04-11 DIAGNOSIS — M545 Low back pain: Secondary | ICD-10-CM | POA: Diagnosis not present

## 2016-04-11 DIAGNOSIS — M5136 Other intervertebral disc degeneration, lumbar region: Secondary | ICD-10-CM

## 2016-04-11 DIAGNOSIS — M47816 Spondylosis without myelopathy or radiculopathy, lumbar region: Secondary | ICD-10-CM | POA: Insufficient documentation

## 2016-04-11 DIAGNOSIS — S86311S Strain of muscle(s) and tendon(s) of peroneal muscle group at lower leg level, right leg, sequela: Secondary | ICD-10-CM | POA: Diagnosis not present

## 2016-04-11 HISTORY — DX: Spondylosis without myelopathy or radiculopathy, lumbar region: M47.816

## 2016-04-11 MED ORDER — PREDNISONE 50 MG PO TABS: ORAL_TABLET | ORAL | 0 refills | Status: DC

## 2016-04-11 NOTE — Assessment & Plan Note (Addendum)
Prednisone, continue meloxicam, x-rays, formal physical therapy. Return in one month, MRI for interventional planning if no better.

## 2016-04-11 NOTE — Assessment & Plan Note (Signed)
Seems to have improved with Flomax, he will be better attention to this over the next month.

## 2016-04-11 NOTE — Assessment & Plan Note (Signed)
Discontinue lateral heel wedge, seems to have resolved simply with using the wedge.

## 2016-04-11 NOTE — Progress Notes (Signed)
  Subjective:    CC: Low back pain  HPI: Peroneal tendinitis: Seems to have resolved with simply using a lateral heel wedge. He has remove the wedge and is doing well.  Low back pain: Moderate, persistent, worse with sitting, flexion, Valsalva, no overt radiculopathy, no bowel or bladder dysfunction, saddle numbness, no constitutional symptoms.  Obstructive uropathy: Seems to have improved with Flomax.  Past medical history:  Negative.  See flowsheet/record as well for more information.  Surgical history: Negative.  See flowsheet/record as well for more information.  Family history: Negative.  See flowsheet/record as well for more information.  Social history: Negative.  See flowsheet/record as well for more information.  Allergies, and medications have been entered into the medical record, reviewed, and no changes needed.   Review of Systems: No fevers, chills, night sweats, weight loss, chest pain, or shortness of breath.   Objective:    General: Well Developed, well nourished, and in no acute distress.  Neuro: Alert and oriented x3, extra-ocular muscles intact, sensation grossly intact.  HEENT: Normocephalic, atraumatic, pupils equal round reactive to light, neck supple, no masses, no lymphadenopathy, thyroid nonpalpable.  Skin: Warm and dry, no rashes. Cardiac: Regular rate and rhythm, no murmurs rubs or gallops, no lower extremity edema.  Respiratory: Clear to auscultation bilaterally. Not using accessory muscles, speaking in full sentences. Back Exam:  Inspection: Unremarkable  Motion: Flexion 45 deg, Extension 45 deg, Side Bending to 45 deg bilaterally,  Rotation to 45 deg bilaterally  SLR laying: Negative  XSLR laying: Negative  Palpable tenderness: None. FABER: negative. Sensory change: Gross sensation intact to all lumbar and sacral dermatomes.  Reflexes: 2+ at both patellar tendons, 2+ at achilles tendons, Babinski's downgoing.  Strength at foot  Plantar-flexion: 5/5  Dorsi-flexion: 5/5 Eversion: 5/5 Inversion: 5/5  Leg strength  Quad: 5/5 Hamstring: 5/5 Hip flexor: 5/5 Hip abductors: 5/5  Gait unremarkable.  Impression and Recommendations:    Lumbar degenerative disc disease Prednisone, continue meloxicam, x-rays, formal physical therapy. Return in one month, MRI for interventional planning if no better.  History of right peroneus brevis tear post repair Discontinue lateral heel wedge, seems to have resolved simply with using the wedge.  Obstructive uropathy Seems to have improved with Flomax, he will be better attention to this over the next month.

## 2016-04-13 ENCOUNTER — Other Ambulatory Visit: Payer: Self-pay | Admitting: Sports Medicine

## 2016-04-18 ENCOUNTER — Ambulatory Visit: Payer: BLUE CROSS/BLUE SHIELD | Admitting: Sports Medicine

## 2016-04-25 ENCOUNTER — Encounter: Payer: Self-pay | Admitting: Physical Therapy

## 2016-04-25 ENCOUNTER — Ambulatory Visit (INDEPENDENT_AMBULATORY_CARE_PROVIDER_SITE_OTHER): Payer: BLUE CROSS/BLUE SHIELD | Admitting: Physical Therapy

## 2016-04-25 DIAGNOSIS — M545 Low back pain, unspecified: Secondary | ICD-10-CM

## 2016-04-25 DIAGNOSIS — G8929 Other chronic pain: Secondary | ICD-10-CM | POA: Diagnosis not present

## 2016-04-25 DIAGNOSIS — M62830 Muscle spasm of back: Secondary | ICD-10-CM

## 2016-04-25 DIAGNOSIS — R531 Weakness: Secondary | ICD-10-CM

## 2016-04-25 NOTE — Patient Instructions (Addendum)
Trigger Point Dry Needling  . What is Trigger Point Dry Needling (DN)? o DN is a physical therapy technique used to treat muscle pain and dysfunction. Specifically, DN helps deactivate muscle trigger points (muscle knots).  o A thin filiform needle is used to penetrate the skin and stimulate the underlying trigger point. The goal is for a local twitch response (LTR) to occur and for the trigger point to relax. No medication of any kind is injected during the procedure.   . What Does Trigger Point Dry Needling Feel Like?  o The procedure feels different for each individual patient. Some patients report that they do not actually feel the needle enter the skin and overall the process is not painful. Very mild bleeding may occur. However, many patients feel a deep cramping in the muscle in which the needle was inserted. This is the local twitch response.   Marland Kitchen How Will I feel after the treatment? o Soreness is normal, and the onset of soreness may not occur for a few hours. Typically this soreness does not last longer than two days.  o Bruising is uncommon, however; ice can be used to decrease any possible bruising.  o In rare cases feeling tired or nauseous after the treatment is normal. In addition, your symptoms may get worse before they get better, this period will typically not last longer than 24 hours.   . What Can I do After My Treatment? o Increase your hydration by drinking more water for the next 24 hours. o You may place ice or heat on the areas treated that have become sore, however, do not use heat on inflamed or bruised areas. Heat often brings more relief post needling. o You can continue your regular activities, but vigorous activity is not recommended initially after the treatment for 24 hours. o DN is best combined with other physical therapy such as strengthening, stretching, and other therapies.   TENS UNIT: This is helpful for muscle pain and spasm.   Search and Purchase a TENS  7000 2nd edition at www.tenspros.com. It should be less than $30.     TENS unit instructions: Do not shower or bathe with the unit on Turn the unit off before removing electrodes or batteries If the electrodes lose stickiness add a drop of water to the electrodes after they are disconnected from the unit and place on plastic sheet. If you continued to have difficulty, call the TENS unit company to purchase more electrodes. Do not apply lotion on the skin area prior to use. Make sure the skin is clean and dry as this will help prolong the life of the electrodes. After use, always check skin for unusual red areas, rash or other skin difficulties. If there are any skin problems, does not apply electrodes to the same area. Never remove the electrodes from the unit by pulling the wires. Do not use the TENS unit or electrodes other than as directed. Do not change electrode placement without consultating your therapist or physician. Keep 2 fingers with between each electrode. Wear time ratio is 2:1, on to off times.    For example on for 30 minutes off for 15 minutes and then on for 30 minutes off for 15 minutes  Trunk: Knees to Chest    Lie on firm, flat surface. Keep head and shoulders flat on surface. Tuck hands behind knees and pull to chest. Hold _30_ seconds. Repeat __2__ times. Do ___1-2_ sessions per day. CAUTION: Movement should be gentle and slow.  Lower Trunk Rotation Stretch    Keeping back flat and feet together, rotate knees to left side. Hold _5-10___ seconds. Lift knees and rotate to other side.  Repeat __5-10__ times per set. Do __1__ sets per session. Do __1-2__ sessions per day. Copyright  [employer]. All rights reserved.

## 2016-04-25 NOTE — Therapy (Signed)
[facility] [place] [address] [place], [place], [address] Phone: [phone]   Fax:  [phone]  Physical Therapy Evaluation  Patient Details  Name: Frederick Ward MRN: [id number] Date of Birth: 1970-10-03 Referring Provider: Dr Benjamin Stain  Encounter Date: 04/25/2016      PT End of Session - 04/25/16 1150    Visit Number 1   Number of Visits 8   Date for PT Re-Evaluation 05/23/16   PT Start Time 1151   PT Stop Time 1259   PT Time Calculation (min) 68 min   Activity Tolerance Patient tolerated treatment well      Past Medical History:  Diagnosis Date  . Pain    LEFT SHOULDER- ROTATOR CUFF  TEAR  . 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 Yellow jacket sting allergy     Past Surgical History:  Procedure Laterality Date  . ANTERIOR CRUCIATE LIGAMENT REPAIR  1997 OR 1998   LEFT KNEE  . LEFT ANKLE TENDON TRANSFER  FEB 2010  . RIGHT ANKLE TENDON TRANSFER  SEPT 30, 2013   SURGERY WAS DONE IN [facility]--PT WEARS  ORTHOTIC BOOT WHEN AMBULATING--HEALING INCISION--SOME WEAKNESS STILL  . RIGHT WRIST FIXATION AUG  2006    . SHOULDER ARTHROSCOPY WITH ROTATOR CUFF REPAIR AND SUBACROMIAL DECOMPRESSION  06/18/2012   Procedure: SHOULDER ARTHROSCOPY WITH ROTATOR CUFF REPAIR AND SUBACROMIAL DECOMPRESSION;  Surgeon: Javier Docker, MD;  Location: [facility];  Service: Orthopedics;  Laterality: Left;  Left Shoulder arthroscopy subacromial decompression mini open rotator cuff repair   . VENTRICULOPERITONEAL SHUNT      There were no vitals filed for this visit.       Subjective Assessment - 04/25/16 1151    Subjective Pt reports he noticed low back pain about a month ago, insidious onset. The pain has been moving around, from Rt buttocks to Lt and in the center of the back.  Has stiffness in the AM.  Was on prednisone for 5 days,  stopped a week ago, using anti-inflammatory, uses heat, lift exercise and stretching.    How long can you sit comfortably? increased with prolonged sitting   How long can you stand comfortably? moderate time will feel tired   How long can you walk comfortably? has increased pain with walking long distance   Diagnostic tests x-rays - DDD   Patient Stated Goals play with kids, throw ball and goof off, return to baseline, return to gym   Currently in Pain? Yes   Pain Score 2    Pain Location Back   Pain Orientation Right;Left;Lower   Pain Descriptors / Indicators Tightness  stiffness   Pain Type Acute pain   Aggravating Factors  lifting batteries   Pain Relieving Factors ice, heat, stretches, wearing back brace            [facility] PT Assessment - 04/25/16 0001      Assessment   Medical Diagnosis Lumbar DDD   Referring Provider Dr Benjamin Stain   Onset Date/Surgical Date 03/26/16   Hand Dominance Right   Next MD Visit 05/13/16   Prior Therapy none for back     Precautions   Precautions None   Required Braces or Orthoses Spinal Brace  velcro stabilzer while lifting     Balance Screen   Has the patient fallen in the past 6 months No   Has the patient had  a decrease in activity level because of a fear of falling?  No   Is the patient reluctant to leave their home because of a fear of falling?  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    Leisure playing with kids - able to now, better after medication,      Observation/Other Assessments   Focus on Therapeutic Outcomes (FOTO)  43% limited     Functional Tests   Functional tests Squat;Single leg stance     Squat   Comments slight shift to the left     Single Leg Stance    Comments bilat WNL     Posture/Postural Control   Posture/Postural Control Postural limitations   Postural Limitations Rounded Shoulders;Forward head  Rt shoulder complex elevated     ROM / Strength   AROM / PROM / Strength AROM;Strength     AROM   AROM Assessment Site Lumbar   Lumbar Flexion 1" from floor   Lumbar Extension WNL, pulling in low back   Lumbar - Right Side Bend WNL   Lumbar - Left Side Bend WNL   Lumbar - Right Rotation WNL   Lumbar - Left Rotation WNL     Strength   Strength Assessment Site Hip;Knee;Ankle;Lumbar   Right/Left Hip --  bilat WNL except ext Rt 5-/5, Lt 4+/5   Right/Left Knee --  Rt WNL, Lt 5-/5   Right/Left Ankle --  bilat WNL   Lumbar Flexion --  TA fair   Lumbar Extension --  multifidi Rt fair, Lt good (-) with slight delay     Flexibility   Soft Tissue Assessment /Muscle Length yes   Hamstrings bilat WNL   Quadriceps prone knee flexion Rt 132, Lt 134  Rt hip flexors tight      Palpation   Spinal mobility sacrum/lumbar/lower thoracic good with CPA & bilat UPA   Palpation comment tightness in bilat lumbar paraspinals andsome iinto Rt QL      Special Tests    Special Tests --  (-) lumbar and SIJ tests                   [facility] Adult PT Treatment/Exercise - 04/25/16 0001      Exercises   Exercises Lumbar     Lumbar Exercises: Stretches   Double Knee to Chest Stretch 2 reps;20 seconds   Lower Trunk Rotation 5 reps;10 seconds     Modalities   Modalities Electrical Stimulation;Moist Heat     Moist Heat Therapy   Number Minutes Moist Heat 15 Minutes   Moist Heat Location Lumbar Spine     Electrical Stimulation   Electrical Stimulation Location lumbar   Electrical Stimulation Action IFC   Electrical Stimulation Parameters to tolerance   Electrical Stimulation Goals Pain;Tone     Manual Therapy   Manual Therapy Soft tissue mobilization   Soft tissue mobilization Rt lumbar paraspinals          Trigger Point Dry  Needling - 04/25/16 1246    Consent Given? Yes   Education Handout Provided Yes   Muscles Treated Upper Body Longissimus  Rt lumbar   Longissimus Response Palpable increased muscle length;Twitch response elicited  L2-5  PT Education - 04/25/16 1244    Education provided Yes   Education Details HEP, TENs unit, TDN   Person(s) Educated Patient   Methods Explanation;Demonstration;Handout   Comprehension Returned demonstration             PT Long Term Goals - 04/25/16 1251      PT LONG TERM GOAL #1   Title I with advanced HEP ( 05/23/16)    Time 4   Period Weeks   Status New     PT LONG TERM GOAL #2   Title improve FOTO =/< 24% limited ( 05/23/16)    Time 4   Period Weeks   Status New     PT LONG TERM GOAL #3   Title demo strong symmetrical contraction of multifidi ( 05/23/16)    Time 4   Period Weeks   Status New     PT LONG TERM GOAL #4   Title report overall pain reduction in low back =/< 1/10 at the end of his work shift ( 05/23/16)    Time 4   Period Weeks   Status New     PT LONG TERM GOAL #5   Title report ability to throw a ball and play with kids per his previous level ( 05/23/16)    Time 4   Period Weeks   Status New               Plan - 04/25/16 1248    Clinical Impression Statement 45 yo male with insidious onset of low back pain. It has settled down some after a course of prednisone.  Currently he has tightness in bilat lumbar paraspinals Rt > Lt and the Rt QL.  Weakness in his core muscle and pain with functional acitivities and work.    Rehab Potential Excellent   PT Frequency 2x / week   PT Duration 4 weeks   PT Treatment/Interventions Moist Heat;Traction;Ultrasound;Therapeutic exercise;Dry needling;Taping;Manual techniques;Neuromuscular re-education;Cryotherapy;Electrical Stimulation;Patient/family education;Passive range of motion   PT Next Visit Plan assess response to TDN, manual work to back and add in pelvic press  routine if ready   Consulted and Agree with Plan of Care Patient      Patient will benefit from skilled therapeutic intervention in order to improve the following deficits and impairments:  Decreased strength, Pain, Increased muscle spasms  Visit Diagnosis: Acute bilateral low back pain without sciatica - Plan: PT plan of care cert/re-cert  Weakness generalized - Plan: PT plan of care cert/re-cert  Muscle spasm of back - Plan: PT plan of care cert/re-cert     Problem List Patient Active Problem List   Diagnosis Date Noted  . Lumbar degenerative disc disease 04/11/2016  . Obstructive uropathy 03/19/2016  . Osteoarthritis of both knees 07/26/2014  . History of right peroneus brevis tear post repair 07/26/2014  . Hyperlipidemia 07/26/2014  . S/P ventriculoperitoneal shunt 07/26/2014  . Annual physical exam 07/26/2014  . Rotator cuff tear, left 06/18/2012    Roderic ScarceSusan Romy Ipock PT  04/25/2016, 12:55 PM  [facility] [address] [place], [place], [address] Phone: [phone]   Fax:  [phone]  Name: Loreli Dollarhillip Brine MRN: [id number] Date of Birth: 08/29/1970

## 2016-05-01 ENCOUNTER — Encounter: Payer: Self-pay | Admitting: Physical Therapy

## 2016-05-01 ENCOUNTER — Ambulatory Visit (INDEPENDENT_AMBULATORY_CARE_PROVIDER_SITE_OTHER): Payer: BLUE CROSS/BLUE SHIELD | Admitting: Physical Therapy

## 2016-05-01 DIAGNOSIS — M545 Low back pain, unspecified: Secondary | ICD-10-CM

## 2016-05-01 DIAGNOSIS — M62830 Muscle spasm of back: Secondary | ICD-10-CM | POA: Diagnosis not present

## 2016-05-01 DIAGNOSIS — R531 Weakness: Secondary | ICD-10-CM | POA: Diagnosis not present

## 2016-05-01 NOTE — Therapy (Signed)
[place], [place], [address] Phone: [phone]   Fax:  [phone]  Physical Therapy Treatment  Patient Details  Name: Frederick Ward MRN: [id number] Date of Birth: [birth year] Referring Provider: Dr Dianah Field  Encounter Date: 05/01/2016      PT End of Session - 05/01/16 1101    Visit Number 2   Number of Visits 8   Date for PT Re-Evaluation 05/23/16   PT Start Time 1100   PT Stop Time 1154   PT Time Calculation (min) 54 min   Activity Tolerance Patient tolerated treatment well      Past Medical History:  Diagnosis Date  . Pain    LEFT SHOULDER- ROTATOR CUFF  TEAR  . 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 Yellow jacket sting allergy     Past Surgical History:  Procedure Laterality Date  . ANTERIOR CRUCIATE LIGAMENT REPAIR  1997 OR 1998   LEFT KNEE  . LEFT ANKLE TENDON TRANSFER  FEB 2010  . RIGHT ANKLE TENDON TRANSFER  SEPT 30, 2013   SURGERY WAS DONE IN [facility]--PT WEARS  ORTHOTIC BOOT WHEN AMBULATING--HEALING INCISION--SOME WEAKNESS STILL  . RIGHT WRIST FIXATION AUG  2006    . SHOULDER ARTHROSCOPY WITH ROTATOR CUFF REPAIR AND SUBACROMIAL DECOMPRESSION  06/18/2012   Procedure: SHOULDER ARTHROSCOPY WITH ROTATOR CUFF REPAIR AND SUBACROMIAL DECOMPRESSION;  Surgeon: Johnn Hai, MD;  Location: [facility];  Service: Orthopedics;  Laterality: Left;  Left Shoulder arthroscopy subacromial decompression mini open rotator cuff repair   . VENTRICULOPERITONEAL SHUNT      There were no vitals filed for this visit.      Subjective Assessment - 05/01/16 1101    Subjective Pt reports he thinks the Rt side is improved however the Lt side is so tender it takes away from the Rt at the end of the day.    Patient Stated Goals play with kids, throw ball and goof off, return to baseline,  return to gym   Currently in Pain? Yes   Pain Score 3    Pain Location Back   Pain Orientation Left;Right;Lower   Pain Descriptors / Indicators Aching;Tightness   Pain Type Acute pain                         [facility] Adult PT Treatment/Exercise - 05/01/16 0001      Lumbar Exercises: Stretches   Double Knee to Chest Stretch 2 reps;30 seconds   Lower Trunk Rotation --  10 reps   Quadruped Mid Back Stretch --  childs pose x 20 sec     Modalities   Modalities Electrical Stimulation;Moist Heat     Moist Heat Therapy   Number Minutes Moist Heat 15 Minutes   Moist Heat Location Lumbar Spine  buttocks     Electrical Stimulation   Electrical Stimulation Location lumbar an dLt buttocks   Electrical Stimulation Action IFC   Electrical Stimulation Parameters to tolerance   Electrical Stimulation Goals Pain;Tone     Manual Therapy   Manual Therapy Soft tissue mobilization   Soft tissue mobilization bilat lumbar paraspinals, Lt tighter than Rt, Lt gluts and piriformis with TPR          Trigger Point Dry Needling - 05/01/16 1104    Consent Given? Yes   Education Handout Provided No  Muscles Treated Upper Body Longissimus  bilat L1-5 bilat with stim   Muscles Treated Lower Body Piriformis;Gluteus maximus  Left   Longissimus Response Palpable increased muscle length;Twitch response elicited   Gluteus Maximus Response Palpable increased muscle length;Twitch response elicited   Piriformis Response Palpable increased muscle length;Twitch response elicited                   PT Long Term Goals - 05/01/16 1124      PT LONG TERM GOAL #1   Title I with advanced HEP ( 05/23/16)    Status On-going     PT LONG TERM GOAL #2   Title improve FOTO =/< 24% limited ( 05/23/16)    Status On-going     PT LONG TERM GOAL #3   Title demo strong symmetrical contraction of multifidi ( 05/23/16)    Status On-going     PT LONG TERM GOAL #4   Title report overall pain  reduction in low back =/< 1/10 at the end of his work shift ( 05/23/16)      PT LONG TERM GOAL #5   Title report ability to throw a ball and play with kids per his previous level ( 05/23/16)    Status On-going               Plan - 05/01/16 1125    Clinical Impression Statement Pt had some relief after the last visit, he is more tender on the Lt side now than Rt. No goals met, only second visit. He has a lot of tightness in the lumbar paraspinals.    Rehab Potential Excellent   PT Frequency 2x / week   PT Duration 4 weeks   PT Treatment/Interventions Moist Heat;Traction;Ultrasound;Therapeutic exercise;Dry needling;Taping;Manual techniques;Neuromuscular re-education;Cryotherapy;Electrical Stimulation;Patient/family education;Passive range of motion   PT Next Visit Plan assess response to TDN, manual work to back and add in pelvic press routine if ready   Consulted and Agree with Plan of Care Patient      Patient will benefit from skilled therapeutic intervention in order to improve the following deficits and impairments:  Decreased strength, Pain, Increased muscle spasms  Visit Diagnosis: Muscle spasm of back  Weakness generalized  Acute bilateral low back pain without sciatica     Problem List Patient Active Problem List   Diagnosis Date Noted  . Lumbar degenerative disc disease 04/11/2016  . Obstructive uropathy 03/19/2016  . Osteoarthritis of both knees 07/26/2014  . History of right peroneus brevis tear post repair 07/26/2014  . Hyperlipidemia 07/26/2014  . S/P ventriculoperitoneal shunt 07/26/2014  . Annual physical exam 07/26/2014  . Rotator cuff tear, left 06/18/2012    Jeral Pinch PT  05/01/2016, 11:41 AM  [facility] [place], [place], [address] Phone: [phone]   Fax:  [phone]  Name: Frederick Ward MRN: [id number] Date of Birth: 1971/06/28

## 2016-05-03 ENCOUNTER — Ambulatory Visit (INDEPENDENT_AMBULATORY_CARE_PROVIDER_SITE_OTHER): Payer: BLUE CROSS/BLUE SHIELD | Admitting: Physical Therapy

## 2016-05-03 DIAGNOSIS — R531 Weakness: Secondary | ICD-10-CM | POA: Diagnosis not present

## 2016-05-03 DIAGNOSIS — M545 Low back pain, unspecified: Secondary | ICD-10-CM

## 2016-05-03 DIAGNOSIS — M62830 Muscle spasm of back: Secondary | ICD-10-CM | POA: Diagnosis not present

## 2016-05-03 NOTE — Patient Instructions (Signed)
Hamstring Stretch    Straighten knee, with foot in strap.  Pull leg up until a stretch is felt in back of leg.  Hold _30__ seconds. Relax knee by returning foot to start. Repeat _3__ times, at least 1 time per day.   Cat / Cow Flow    Inhale, press spine toward ceiling like a Halloween cat. Keeping strength in arms and abdominals, exhale to soften spine through neutral and into cow pose. Open chest and arch back. Initiate movement between cat and cow at tailbone, one vertebrae at a time. Repeat _5___ times.  Piriformis Stretch, Sitting    Sit, one ankle on opposite knee, same-side hand on crossed knee. Push down on knee, keeping spine straight. Next, relax leg and lean torso forward, with flat back, until tension is felt in hamstrings and gluteals of crossed-leg side. Hold _30__ seconds.  Repeat _2-3__ times per session. Do __2_ sessions per day.   [facility] [address] [place], [place] [address]  [phone] (office) [phone] (fax)

## 2016-05-03 NOTE — Therapy (Signed)
[facility] [place] [address] [place], [place], [address] Phone: [phone]   Fax:  [phone]  Physical Therapy Treatment  Patient Details  Name: Frederick Ward MRN: [id number] Date of Birth: Sep 03, 1970 Referring Provider: Dr. Briant Sites  Encounter Date: 05/03/2016      PT End of Session - 05/03/16 1153    Visit Number 3   Number of Visits 8   Date for PT Re-Evaluation 05/23/16   PT Start Time 1149   PT Stop Time 1247   PT Time Calculation (min) 58 min   Activity Tolerance Patient tolerated treatment well;No increased pain      Past Medical History:  Diagnosis Date  . Pain    LEFT SHOULDER- ROTATOR CUFF  TEAR  . 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 Yellow jacket sting allergy     Past Surgical History:  Procedure Laterality Date  . ANTERIOR CRUCIATE LIGAMENT REPAIR  1997 OR 1998   LEFT KNEE  . LEFT ANKLE TENDON TRANSFER  FEB 2010  . RIGHT ANKLE TENDON TRANSFER  SEPT 30, 2013   SURGERY WAS DONE IN [facility]--PT WEARS  ORTHOTIC BOOT WHEN AMBULATING--HEALING INCISION--SOME WEAKNESS STILL  . RIGHT WRIST FIXATION AUG  2006    . SHOULDER ARTHROSCOPY WITH ROTATOR CUFF REPAIR AND SUBACROMIAL DECOMPRESSION  06/18/2012   Procedure: SHOULDER ARTHROSCOPY WITH ROTATOR CUFF REPAIR AND SUBACROMIAL DECOMPRESSION;  Surgeon: Javier Docker, MD;  Location: [facility];  Service: Orthopedics;  Laterality: Left;  Left Shoulder arthroscopy subacromial decompression mini open rotator cuff repair   . VENTRICULOPERITONEAL SHUNT      There were no vitals filed for this visit.      Subjective Assessment - 05/03/16 1154    Subjective Pt reports his Rt side improved after initial eval, Lt side TDN provided no relief.  He still feels best in morning, but as day goes on pain increases.  He has used his TENS unit with some relief.     Patient Stated Goals play with kids, throw ball and goof off, return to baseline, return to gym   Currently in Pain? Yes   Pain Score 4    Pain Location Back   Pain Orientation Right;Left;Lower            [facility] PT Assessment - 05/03/16 0001      Assessment   Medical Diagnosis Lumbar DDD   Referring Provider Dr. Benedetto Coons Dominance Right   Next MD Visit 05/09/16   Prior Therapy none for back          [facility] Adult PT Treatment/Exercise - 05/03/16 0001      Self-Care   Self-Care Other Self-Care Comments   Other Self-Care Comments  Pt educated on self massage with use of ball in standing  - pt returned demo.  Pt educated on using lumbar support in truck and at desk.       Lumbar Exercises: Stretches   Passive Hamstring Stretch 3 reps;30 seconds   Double Knee to Chest Stretch 2 reps;30 seconds   Lower Trunk Rotation --  10 reps   Prone on Elbows Stretch 2 reps;30 seconds   Quadruped Mid Back Stretch 5 reps   Prone Mid Back Stretch --  childs pose x 2 reps   Piriformis Stretch 3 reps;30 seconds     Lumbar Exercises: Aerobic   Stationary Bike NuStep L5:  6 min      Electrical Stimulation   Electrical Stimulation Location Lumbar paraspinals and glute med bilat   Electrical Stimulation Action IFC   Electrical Stimulation Parameters to tolerance    Electrical Stimulation Goals Tone;Pain     Manual Therapy   Manual Therapy Soft tissue mobilization   Soft tissue mobilization Cross fiber to lumbar paraspinals, deep pressure and TPR to glute med/max/ piriformis on each side.                 PT Education - 05/03/16 1250    Education provided Yes   Education Details HEP    Person(s) Educated Patient   Methods Handout;Explanation;Tactile cues;Demonstration;Verbal cues   Comprehension Returned demonstration;Verbalized understanding             PT Long Term Goals - 05/03/16 1245      PT LONG TERM GOAL #1   Title I with advanced HEP ( 05/23/16)     Time 4   Period Weeks   Status On-going     PT LONG TERM GOAL #2   Title improve FOTO =/< 24% limited ( 05/23/16)    Time 4   Period Weeks   Status On-going     PT LONG TERM GOAL #3   Title demo strong symmetrical contraction of multifidi ( 05/23/16)    Time 4   Period Weeks   Status On-going     PT LONG TERM GOAL #4   Title report overall pain reduction in low back =/< 1/10 at the end of his work shift ( 05/23/16)    Time 4   Period Weeks   Status On-going     PT LONG TERM GOAL #5   Title report ability to throw a ball and play with kids per his previous level ( 05/23/16)    Time 4   Period Weeks   Status On-going               Plan - 05/03/16 1246    Clinical Impression Statement Pt tolerated all new stretches well, reporting some decrease in pain and tightness after performance of these. Pt reported further reduction with manual therapy and use of MHP / estim.  Pt making gradual progress towards goals.    Rehab Potential Excellent   PT Frequency 2x / week   PT Duration 4 weeks   PT Treatment/Interventions Moist Heat;Traction;Ultrasound;Therapeutic exercise;Dry needling;Taping;Manual techniques;Neuromuscular re-education;Cryotherapy;Electrical Stimulation;Patient/family education;Passive range of motion   PT Next Visit Plan Add pelvic press series.     Consulted and Agree with Plan of Care Patient      Patient will benefit from skilled therapeutic intervention in order to improve the following deficits and impairments:  Decreased strength, Pain, Increased muscle spasms  Visit Diagnosis: Muscle spasm of back  Weakness generalized  Acute bilateral low back pain without sciatica     Problem List Patient Active Problem List   Diagnosis Date Noted  . Lumbar degenerative disc disease 04/11/2016  . Obstructive uropathy 03/19/2016  . Osteoarthritis of both knees 07/26/2014  . History of right peroneus brevis tear post repair 07/26/2014  . Hyperlipidemia  07/26/2014  . S/P ventriculoperitoneal shunt 07/26/2014  . Annual physical exam 07/26/2014  . Rotator cuff tear, left 06/18/2012   Mayer CamelJennifer Carlson-Long, PTA 05/03/16 12:52 PM  [facility] [address] [place], [place], [address] Phone: [phone]   Fax:  [phone]  Name: Frederick Ward MRN: [id number] Date of Birth: 04/21/1971

## 2016-05-08 ENCOUNTER — Ambulatory Visit (INDEPENDENT_AMBULATORY_CARE_PROVIDER_SITE_OTHER): Payer: BLUE CROSS/BLUE SHIELD | Admitting: Physical Therapy

## 2016-05-08 DIAGNOSIS — R531 Weakness: Secondary | ICD-10-CM | POA: Diagnosis not present

## 2016-05-08 DIAGNOSIS — M62830 Muscle spasm of back: Secondary | ICD-10-CM

## 2016-05-08 DIAGNOSIS — M545 Low back pain, unspecified: Secondary | ICD-10-CM

## 2016-05-08 NOTE — Therapy (Addendum)
[facility] [address] [place], [place], [address] Phone: [phone]   Fax:  [phone]  Physical Therapy Treatment  Patient Details  Name: Frederick Ward MRN: [id number] Date of Birth: 03/27/1971 Referring Provider: Dr. Thekkekandem   Encounter Date: 05/08/2016      PT End of Session - 05/08/16 1407    Visit Number 4   Number of Visits 8   Date for PT Re-Evaluation 05/23/16   PT Start Time 1404   PT Stop Time 1449   PT Time Calculation (min) 45 min   Activity Tolerance Patient tolerated treatment well;No increased pain      Past Medical History:  Diagnosis Date  . Pain    LEFT SHOULDER- ROTATOR CUFF  TEAR  . 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 Yellow jacket sting allergy     Past Surgical History:  Procedure Laterality Date  . ANTERIOR CRUCIATE LIGAMENT REPAIR  1997 OR 1998   LEFT KNEE  . LEFT ANKLE TENDON TRANSFER  FEB 2010  . RIGHT ANKLE TENDON TRANSFER  SEPT 30, 2013   SURGERY WAS DONE IN [facility]--PT WEARS  ORTHOTIC BOOT WHEN AMBULATING--HEALING INCISION--SOME WEAKNESS STILL  . RIGHT WRIST FIXATION AUG  2006    . SHOULDER ARTHROSCOPY WITH ROTATOR CUFF REPAIR AND SUBACROMIAL DECOMPRESSION  06/18/2012   Procedure: SHOULDER ARTHROSCOPY WITH ROTATOR CUFF REPAIR AND SUBACROMIAL DECOMPRESSION;  Surgeon: Jeffrey C Beane, MD;  Location: [facility];  Service: Orthopedics;  Laterality: Left;  Left Shoulder arthroscopy subacromial decompression mini open rotator cuff repair   . VENTRICULOPERITONEAL SHUNT      There were no vitals filed for this visit.      Subjective Assessment - 05/08/16 1407    Subjective Pt reports he was sore the night of last session.   Over last week, he estimates 30% improvement in pain.    Currently in Pain? Yes   Pain Score 2    Pain Location Back   Pain Descriptors /  Indicators Aching   Aggravating Factors  end of day.    Pain Relieving Factors ice, TENS, stretches            [facility] PT Assessment - 05/08/16 0001      Assessment   Medical Diagnosis Lumbar DDD   Referring Provider Dr. Thekkekandem    Onset Date/Surgical Date 03/26/16   Hand Dominance Right   Next MD Visit 05/09/16   Prior Therapy none for back           [facility] Adult PT Treatment/Exercise - 05/08/16 0001      Lumbar Exercises: Stretches   Passive Hamstring Stretch 3 reps;30 seconds   Double Knee to Chest Stretch 60 seconds   ITB Stretch 2 reps  supine with strap     Lumbar Exercises: Aerobic   Stationary Bike NuStep L6: 5 min      Lumbar Exercises: Prone   Other Prone Lumbar Exercises Pelvic press x 5 sec x 10 reps; then with unilateral knee flex x 5 reps, then hip ext x 5 each leg, then bilat knee flex x 5 reps.    Other Prone Lumbar Exercises childs pose x 2 reps x 2     Modalities   Modalities Electrical Stimulation;Moist Heat     Moist Heat Therapy   Number Minutes Moist Heat 15 Minutes   Moist Heat Location Lumbar Spine       Acupuncturist Location Lumbar paraspinals and glute med Advertising account executive [employer]   Electrical Stimulation Parameters to tolerance    Electrical Stimulation Goals Tone;Pain                PT Education - 05/08/16 1444    Education provided Yes   Education Details HEP - added pelvic press and ITB stretch    Person(s) Educated Patient   Methods Explanation;Handout;Tactile cues;Demonstration   Comprehension Verbalized understanding;Returned demonstration             PT Long Term Goals - 05/08/16 1427      PT LONG TERM GOAL #1   Title I with advanced HEP ( 05/23/16)    Time 4   Period Weeks   Status On-going     PT LONG TERM GOAL #2   Title improve FOTO =/< 24% limited ( 05/23/16)    Time 4   Period Weeks   Status On-going     PT LONG TERM GOAL #3   Title demo  strong symmetrical contraction of multifidi ( 05/23/16)    Time 4   Period Weeks   Status On-going  some improvement     PT LONG TERM GOAL #4   Title report overall pain reduction in low back =/< 1/10 at the end of his work shift ( 05/23/16)    Time 4   Period Weeks   Status On-going  up to 4/10      PT LONG TERM GOAL #5   Title report ability to throw a ball and play with kids per his previous level ( 05/23/16)    Time 4   Period Weeks   Status On-going               Plan - 05/08/16 1438    Clinical Impression Statement Pt reporting less overall pain during day and by end of work day.  He tolerated new exercises for multifidi without any symptoms; required minimal cues for form.  Pt is progressing towards established goals.    Rehab Potential Excellent   PT Frequency 2x / week   PT Duration 4 weeks   PT Treatment/Interventions Moist Heat;Traction;Ultrasound;Therapeutic exercise;Dry needling;Taping;Manual techniques;Neuromuscular re-education;Cryotherapy;Electrical Stimulation;Patient/family education;Passive range of motion   PT Next Visit Plan Continue progressive spinal stabilization and manual therapy to hips/ LB.     Consulted and Agree with Plan of Care Patient      Patient will benefit from skilled therapeutic intervention in order to improve the following deficits and impairments:  Decreased strength, Pain, Increased muscle spasms  Visit Diagnosis: Muscle spasm of back  Weakness generalized  Acute bilateral low back pain without sciatica     Problem List Patient Active Problem List   Diagnosis Date Noted  . Lumbar degenerative disc disease 04/11/2016  . Obstructive uropathy 03/19/2016  . Osteoarthritis of both knees 07/26/2014  . History of right peroneus brevis tear post repair 07/26/2014  . Hyperlipidemia 07/26/2014  . S/P ventriculoperitoneal shunt 07/26/2014  . Annual physical exam 07/26/2014  . Rotator cuff tear, left 06/18/2012   Kerin Perna, PTA 05/08/16 2:45 PM  [facility] [place], [place], [address] Phone: [phone]   Fax:  [phone]  Name: Frederick Ward MRN: [id number] Date of Birth: Nov 25, 1970   PHYSICAL THERAPY DISCHARGE SUMMARY  Visits from Start of Care: 4  Current functional level related to goals / functional outcomes: unknown   Remaining  deficits: unknown   Education / Equipment: HEP  Plan: Patient agrees to discharge.  Patient goals were not met. Patient is being discharged due to a change in medical status.  ?????Pt to have follow up with MRI and MD visit    Susan Shaver, PT 06/12/16 6:54 AM   

## 2016-05-08 NOTE — Patient Instructions (Addendum)
Pelvic Press     Place hands under belly between navel and pubic bone, palms up. Feel pressure on hands. Increase pressure on hands by pressing pelvis down. This is NOT a pelvic tilt. Hold __5_ seconds. Relax. Repeat _10__ times. Once a day.  KNEE: Flexion - Prone   Hold pelvic press. Bend knee, then return the foot down. Repeat on opposite leg. Do not raise hips. _10__ reps per set. When this is mastered, pull both heels up at same time, x 10 reps.  Once a day   Leg Lift: One-Leg   Press pelvis down. Keep knee straight; lengthen and lift one leg (from waist). Do not twist body. Keep other leg down. Hold _1__ seconds. Relax. Repeat 10 time. Repeat with other leg.  HIP: Extension / KNEE: Flexion - Prone    [facility] [address] [place], [place] [address]  [phone] (office) [phone] (fax)  Outer Hip Stretch: Reclined IT Band Stretch (Strap)    Strap around opposite foot, pull across only as far as possible with shoulders on mat. Hold for _30___ seconds. Repeat _2-3___ times each leg.

## 2016-05-09 ENCOUNTER — Encounter: Payer: Self-pay | Admitting: Sports Medicine

## 2016-05-09 ENCOUNTER — Ambulatory Visit (INDEPENDENT_AMBULATORY_CARE_PROVIDER_SITE_OTHER): Payer: BLUE CROSS/BLUE SHIELD | Admitting: Sports Medicine

## 2016-05-09 DIAGNOSIS — M51369 Other intervertebral disc degeneration, lumbar region without mention of lumbar back pain or lower extremity pain: Secondary | ICD-10-CM

## 2016-05-09 DIAGNOSIS — M5136 Other intervertebral disc degeneration, lumbar region: Secondary | ICD-10-CM | POA: Diagnosis not present

## 2016-05-09 NOTE — Assessment & Plan Note (Signed)
Noted on x-ray, now with persistent left-sided radiculopathy, and axial discogenic back pain. We do need an MRI, he has failed greater than 6 weeks of physician directed rehabilitation. MRI needs to be coordinated with his neurosurgeon, he has a ventriculoperitoneal shunt which needs to be readjusted after MRIs.  I will place the order and he will coordinate this himself.

## 2016-05-09 NOTE — Progress Notes (Signed)
  Subjective:    CC: Follow-up  HPI: Low back pain: This is a pleasant 45 year old male, he's had a long history of low back pain, and has failed greater than 6 weeks of formal physical therapy, oral medications. X-ray showed multilevel degenerative changes, he is here with persistent pain and wondering what the next step is. He does have a ventriculoperitoneal shunt that does need to be adjusted by his neurosurgeon immediately after MRIs, so we are to do an MRI today.  Past medical history:  Negative.  See flowsheet/record as well for more information.  Surgical history: Negative.  See flowsheet/record as well for more information.  Family history: Negative.  See flowsheet/record as well for more information.  Social history: Negative.  See flowsheet/record as well for more information.  Allergies, and medications have been entered into the medical record, reviewed, and no changes needed.   Review of Systems: No fevers, chills, night sweats, weight loss, chest pain, or shortness of breath.   Objective:    General: Well Developed, well nourished, and in no acute distress.  Neuro: Alert and oriented x3, extra-ocular muscles intact, sensation grossly intact.  HEENT: Normocephalic, atraumatic, pupils equal round reactive to light, neck supple, no masses, no lymphadenopathy, thyroid nonpalpable.  Skin: Warm and dry, no rashes. Cardiac: Regular rate and rhythm, no murmurs rubs or gallops, no lower extremity edema.  Respiratory: Clear to auscultation bilaterally. Not using accessory muscles, speaking in full sentences.  Impression and Recommendations:    Lumbar degenerative disc disease Noted on x-ray, now with persistent left-sided radiculopathy, and axial discogenic back pain. We do need an MRI, he has failed greater than 6 weeks of physician directed rehabilitation. MRI needs to be coordinated with his neurosurgeon, he has a ventriculoperitoneal shunt which needs to be readjusted after  MRIs.  I will place the order and he will coordinate this himself.   I spent 25 minutes with this patient, greater than 50% was face-to-face time counseling regarding the above diagnoses

## 2016-05-20 ENCOUNTER — Ambulatory Visit (INDEPENDENT_AMBULATORY_CARE_PROVIDER_SITE_OTHER): Payer: BLUE CROSS/BLUE SHIELD

## 2016-05-20 DIAGNOSIS — M5136 Other intervertebral disc degeneration, lumbar region: Secondary | ICD-10-CM

## 2016-05-20 DIAGNOSIS — M4606 Spinal enthesopathy, lumbar region: Secondary | ICD-10-CM | POA: Diagnosis not present

## 2016-05-20 DIAGNOSIS — M5126 Other intervertebral disc displacement, lumbar region: Secondary | ICD-10-CM | POA: Diagnosis not present

## 2016-06-04 ENCOUNTER — Ambulatory Visit (INDEPENDENT_AMBULATORY_CARE_PROVIDER_SITE_OTHER): Payer: BLUE CROSS/BLUE SHIELD

## 2016-06-04 ENCOUNTER — Encounter: Payer: Self-pay | Admitting: Sports Medicine

## 2016-06-04 ENCOUNTER — Ambulatory Visit (INDEPENDENT_AMBULATORY_CARE_PROVIDER_SITE_OTHER): Payer: BLUE CROSS/BLUE SHIELD | Admitting: Sports Medicine

## 2016-06-04 DIAGNOSIS — M19041 Primary osteoarthritis, right hand: Secondary | ICD-10-CM | POA: Diagnosis not present

## 2016-06-04 DIAGNOSIS — M5136 Other intervertebral disc degeneration, lumbar region: Secondary | ICD-10-CM

## 2016-06-04 DIAGNOSIS — M1811 Unilateral primary osteoarthritis of first carpometacarpal joint, right hand: Secondary | ICD-10-CM | POA: Diagnosis not present

## 2016-06-04 DIAGNOSIS — N139 Obstructive and reflux uropathy, unspecified: Secondary | ICD-10-CM | POA: Diagnosis not present

## 2016-06-04 DIAGNOSIS — M51369 Other intervertebral disc degeneration, lumbar region without mention of lumbar back pain or lower extremity pain: Secondary | ICD-10-CM

## 2016-06-04 DIAGNOSIS — M19031 Primary osteoarthritis, right wrist: Secondary | ICD-10-CM | POA: Diagnosis not present

## 2016-06-04 HISTORY — DX: Unilateral primary osteoarthritis of first carpometacarpal joint, right hand: M18.11

## 2016-06-04 MED ORDER — TAMSULOSIN HCL 0.4 MG PO CAPS: 0.8000 mg | ORAL_CAPSULE | Freq: Every day | ORAL | 3 refills | Status: DC

## 2016-06-04 NOTE — Assessment & Plan Note (Signed)
X-rays, return after facet joint injections, if still having pain we will inject the joint.

## 2016-06-04 NOTE — Assessment & Plan Note (Signed)
Moderate response to Flomax 0.4, increasing to 0.8 mg

## 2016-06-04 NOTE — Assessment & Plan Note (Signed)
MRI does show multilevel degenerative disc disease however very mild, he does have fairly marked and facet arthritis on the left at L5-S1 and L4-L5, but also on the right side. We are going to proceed with bilateral L4-S1 facet joint injections

## 2016-06-04 NOTE — Progress Notes (Signed)
  Subjective:    CC: Follow-up  HPI: This is a pleasant 45 year old male with low back pain, after failing conservative measures we proceeded with MRI for interventional planning, pain is left more than right, he denies any radicular symptoms, all axial.  Obstructive uropathy: Improvement with 0.4 mg of Flomax, would like to go up on the dose, still has several episodes of urgency, nocturia.  Hand pain: Right first carpometacarpal joint, moderate, persistent without radiation.  Past medical history:  Negative.  See flowsheet/record as well for more information.  Surgical history: Negative.  See flowsheet/record as well for more information.  Family history: Negative.  See flowsheet/record as well for more information.  Social history: Negative.  See flowsheet/record as well for more information.  Allergies, and medications have been entered into the medical record, reviewed, and no changes needed.   Review of Systems: No fevers, chills, night sweats, weight loss, chest pain, or shortness of breath.   Objective:    General: Well Developed, well nourished, and in no acute distress.  Neuro: Alert and oriented x3, extra-ocular muscles intact, sensation grossly intact.  HEENT: Normocephalic, atraumatic, pupils equal round reactive to light, neck supple, no masses, no lymphadenopathy, thyroid nonpalpable.  Skin: Warm and dry, no rashes. Cardiac: Regular rate and rhythm, no murmurs rubs or gallops, no lower extremity edema.  Respiratory: Clear to auscultation bilaterally. Not using accessory muscles, speaking in full sentences. Right hand: Tender to palpation at the thumb basal joint.  Impression and Recommendations:    Lumbar degenerative disc disease MRI does show multilevel degenerative disc disease however very mild, he does have fairly marked and facet arthritis on the left at L5-S1 and L4-L5, but also on the right side. We are going to proceed with bilateral L4-S1 facet joint  injections  Obstructive uropathy Moderate response to Flomax 0.4, increasing to 0.8 mg  Primary osteoarthritis of first carpometacarpal joint of right hand X-rays, return after facet joint injections, if still having pain we will inject the joint.

## 2016-06-20 ENCOUNTER — Ambulatory Visit
Admission: RE | Admit: 2016-06-20 | Discharge: 2016-06-20 | Disposition: A | Discharge status: Home / Self Care | Payer: BLUE CROSS/BLUE SHIELD | Source: Ambulatory Visit | Attending: Sports Medicine | Admitting: Sports Medicine

## 2016-06-20 ENCOUNTER — Other Ambulatory Visit: Payer: Self-pay | Admitting: Sports Medicine

## 2016-06-20 DIAGNOSIS — M5136 Other intervertebral disc degeneration, lumbar region: Secondary | ICD-10-CM

## 2016-06-20 DIAGNOSIS — M545 Low back pain: Secondary | ICD-10-CM | POA: Diagnosis not present

## 2016-06-20 MED ORDER — METHYLPREDNISOLONE ACETATE 40 MG/ML INJ SUSP (RADIOLOG
120.0000 mg | Freq: Once | INTRAMUSCULAR | Status: AC
  Administered 2016-06-20: 120 mg via INTRA_ARTICULAR

## 2016-06-20 MED ORDER — IOPAMIDOL (ISOVUE-M 200) INJECTION 41%
1.0000 mL | Freq: Once | INTRAMUSCULAR | Status: AC
  Administered 2016-06-20: 1 mL via INTRA_ARTICULAR

## 2016-06-20 NOTE — Discharge Instructions (Signed)

## 2016-06-24 ENCOUNTER — Ambulatory Visit (INDEPENDENT_AMBULATORY_CARE_PROVIDER_SITE_OTHER): Payer: BLUE CROSS/BLUE SHIELD | Admitting: Sports Medicine

## 2016-06-24 DIAGNOSIS — M5136 Other intervertebral disc degeneration, lumbar region: Secondary | ICD-10-CM | POA: Diagnosis not present

## 2016-06-24 DIAGNOSIS — M1811 Unilateral primary osteoarthritis of first carpometacarpal joint, right hand: Secondary | ICD-10-CM

## 2016-06-24 DIAGNOSIS — M51369 Other intervertebral disc degeneration, lumbar region without mention of lumbar back pain or lower extremity pain: Secondary | ICD-10-CM

## 2016-06-24 MED ORDER — HYDROCODONE-ACETAMINOPHEN 10-325 MG PO TABS: 1.0000 | ORAL_TABLET | Freq: Three times a day (TID) | ORAL | 0 refills | Status: DC | PRN

## 2016-06-24 NOTE — Assessment & Plan Note (Addendum)
MRI didn't show multilevel degenerative disc disease that was extremely mild however he did have markedly facet arthritis bilaterally. We proceeded with left and right L4-L5 and L5-S1 facet injections that provided concordant pain during the procedure and fantastic relief of the pain afterwards. History and only 4 days ago so we do expect some pain for the next several days, I would like to see him back in one month. If good relief and return of pain he will be a candidate for medial branch blocks and radiofrequency ablation. I will give him a bit of hydrocodone for pain relief until his Depo-Medrol starts working..Marland Kitchen

## 2016-06-24 NOTE — Progress Notes (Signed)
  Subjective:    CC: Follow-up after facet injections  HPI: This is a pleasant 45 year old male, he is 4 days post bilateral L4-S1 facet joint injections with good relief after the injections, near complete pain relief immediately after with occurrence of pain later that day as expected. He has back in the early however.  Trapeziometacarpal osteoarthritis: It's only been 4 days after his facet injections and he understands that we should give this more time before proceeding with basal joint injections  Past medical history:  Negative.  See flowsheet/record as well for more information.  Surgical history: Negative.  See flowsheet/record as well for more information.  Family history: Negative.  See flowsheet/record as well for more information.  Social history: Negative.  See flowsheet/record as well for more information.  Allergies, and medications have been entered into the medical record, reviewed, and no changes needed.   Review of Systems: No fevers, chills, night sweats, weight loss, chest pain, or shortness of breath.   Objective:    General: Well Developed, well nourished, and in no acute distress.  Neuro: Alert and oriented x3, extra-ocular muscles intact, sensation grossly intact.  HEENT: Normocephalic, atraumatic, pupils equal round reactive to light, neck supple, no masses, no lymphadenopathy, thyroid nonpalpable.  Skin: Warm and dry, no rashes. Cardiac: Regular rate and rhythm, no murmurs rubs or gallops, no lower extremity edema.  Respiratory: Clear to auscultation bilaterally. Not using accessory muscles, speaking in full sentences.  Impression and Recommendations:    Lumbar degenerative disc disease MRI didn't show multilevel degenerative disc disease that was extremely mild however he did have markedly facet arthritis bilaterally. We proceeded with left and right L4-L5 and L5-S1 facet injections that provided concordant pain during the procedure and fantastic relief of  the pain afterwards. History and only 4 days ago so we do expect some pain for the next several days, I would like to see him back in one month. If good relief and return of pain he will be a candidate for medial branch blocks and radiofrequency ablation. I will give him a bit of hydrocodone for pain relief until his Depo-Medrol starts working..   Primary osteoarthritis of first carpometacarpal joint of right hand Facet joint injections were just 4 days ago, we will give these a chance to work and hope for systemic steroid effect in his hand.  I spent 25 minutes with this patient, greater than 50% was face-to-face time counseling regarding the above diagnoses

## 2016-06-24 NOTE — Assessment & Plan Note (Signed)
Facet joint injections were just 4 days ago, we will give these a chance to work and hope for systemic steroid effect in his hand.

## 2016-06-25 DIAGNOSIS — D225 Melanocytic nevi of trunk: Secondary | ICD-10-CM | POA: Diagnosis not present

## 2016-06-25 DIAGNOSIS — L570 Actinic keratosis: Secondary | ICD-10-CM | POA: Diagnosis not present

## 2016-06-29 ENCOUNTER — Other Ambulatory Visit: Payer: Self-pay | Admitting: Sports Medicine

## 2016-06-29 DIAGNOSIS — E785 Hyperlipidemia, unspecified: Secondary | ICD-10-CM

## 2016-07-02 DIAGNOSIS — M545 Low back pain: Secondary | ICD-10-CM | POA: Diagnosis not present

## 2016-07-02 DIAGNOSIS — M5137 Other intervertebral disc degeneration, lumbosacral region: Secondary | ICD-10-CM | POA: Diagnosis not present

## 2016-07-02 DIAGNOSIS — M5442 Lumbago with sciatica, left side: Secondary | ICD-10-CM | POA: Diagnosis not present

## 2016-07-02 DIAGNOSIS — M791 Myalgia: Secondary | ICD-10-CM | POA: Diagnosis not present

## 2016-07-02 DIAGNOSIS — M546 Pain in thoracic spine: Secondary | ICD-10-CM | POA: Diagnosis not present

## 2016-07-02 DIAGNOSIS — M47817 Spondylosis without myelopathy or radiculopathy, lumbosacral region: Secondary | ICD-10-CM | POA: Diagnosis not present

## 2016-07-02 DIAGNOSIS — M542 Cervicalgia: Secondary | ICD-10-CM | POA: Diagnosis not present

## 2016-07-12 ENCOUNTER — Telehealth: Payer: Self-pay

## 2016-07-12 DIAGNOSIS — M5136 Other intervertebral disc degeneration, lumbar region: Secondary | ICD-10-CM

## 2016-07-12 DIAGNOSIS — M51369 Other intervertebral disc degeneration, lumbar region without mention of lumbar back pain or lower extremity pain: Secondary | ICD-10-CM

## 2016-07-12 NOTE — Telephone Encounter (Signed)
Pt left VM asking for a refill of Norco. Please advise.

## 2016-07-13 MED ORDER — HYDROCODONE-ACETAMINOPHEN 10-325 MG PO TABS: 1.0000 | ORAL_TABLET | Freq: Three times a day (TID) | ORAL | 0 refills | Status: DC | PRN

## 2016-07-13 NOTE — Telephone Encounter (Signed)
I'm calling in a small amount, if he is continuing to have back pain then he needs to let us know so that I can set him up for facet radiofrequency ablation after medial branch blocks.

## 2016-07-16 DIAGNOSIS — M5387 Other specified dorsopathies, lumbosacral region: Secondary | ICD-10-CM | POA: Diagnosis not present

## 2016-07-16 DIAGNOSIS — M4607 Spinal enthesopathy, lumbosacral region: Secondary | ICD-10-CM | POA: Diagnosis not present

## 2016-07-16 DIAGNOSIS — M47817 Spondylosis without myelopathy or radiculopathy, lumbosacral region: Secondary | ICD-10-CM | POA: Diagnosis not present

## 2016-07-16 DIAGNOSIS — M5442 Lumbago with sciatica, left side: Secondary | ICD-10-CM | POA: Diagnosis not present

## 2016-07-16 DIAGNOSIS — M546 Pain in thoracic spine: Secondary | ICD-10-CM | POA: Diagnosis not present

## 2016-07-16 DIAGNOSIS — M5137 Other intervertebral disc degeneration, lumbosacral region: Secondary | ICD-10-CM | POA: Diagnosis not present

## 2016-07-17 ENCOUNTER — Encounter: Payer: Self-pay | Admitting: Sports Medicine

## 2016-07-17 ENCOUNTER — Ambulatory Visit (INDEPENDENT_AMBULATORY_CARE_PROVIDER_SITE_OTHER): Payer: BLUE CROSS/BLUE SHIELD | Admitting: Sports Medicine

## 2016-07-17 DIAGNOSIS — N139 Obstructive and reflux uropathy, unspecified: Secondary | ICD-10-CM | POA: Diagnosis not present

## 2016-07-17 DIAGNOSIS — M1811 Unilateral primary osteoarthritis of first carpometacarpal joint, right hand: Secondary | ICD-10-CM

## 2016-07-17 DIAGNOSIS — M47816 Spondylosis without myelopathy or radiculopathy, lumbar region: Secondary | ICD-10-CM

## 2016-07-17 DIAGNOSIS — M1288 Other specific arthropathies, not elsewhere classified, other specified site: Secondary | ICD-10-CM

## 2016-07-17 NOTE — Progress Notes (Signed)
  Subjective:    CC: follow-up  HPI: Lumbar facet syndrome: Doing extremely well, had near 100% pain relief immediately after the injections, pain did return as expected, and he is now with about 90% pain relief.  Trapeziometacarpal osteoarthritis: Had mild steroid effect from the facet joint injections but still has significant pain and is agreeable to proceed with interventional treatment today.  Obstructive uropathy: Fantastic improvement with increasing Flomax to 0.8 mg.  Past medical history:  Negative.  See flowsheet/record as well for more information.  Surgical history: Negative.  See flowsheet/record as well for more information.  Family history: Negative.  See flowsheet/record as well for more information.  Social history: Negative.  See flowsheet/record as well for more information.  Allergies, and medications have been entered into the medical record, reviewed, and no changes needed.   Review of Systems: No fevers, chills, night sweats, weight loss, chest pain, or shortness of breath.   Objective:    General: Well Developed, well nourished, and in no acute distress.  Neuro: Alert and oriented x3, extra-ocular muscles intact, sensation grossly intact.  HEENT: Normocephalic, atraumatic, pupils equal round reactive to light, neck supple, no masses, no lymphadenopathy, thyroid nonpalpable.  Skin: Warm and dry, no rashes. Cardiac: Regular rate and rhythm, no murmurs rubs or gallops, no lower extremity edema.  Respiratory: Clear to auscultation bilaterally. Not using accessory muscles, speaking in full sentences.  Procedure: Real-time Ultrasound Guided Injection of right trapeziometacarpal joint Device: GE Logiq E  Verbal informed consent obtained.  Time-out conducted.  Noted no overlying erythema, induration, or other signs of local infection.  Skin prepped in a sterile fashion.  Local anesthesia: Topical Ethyl chloride.  With sterile technique and under real time ultrasound  guidance:  1/2 mL kenalog 40, 1/2 mL lidocaine injected easily. Completed without difficulty  Pain immediately resolved suggesting accurate placement of the medication.  Advised to call if fevers/chills, erythema, induration, drainage, or persistent bleeding.  Images permanently stored and available for review in the ultrasound unit.  Impression: Technically successful ultrasound guided injection.  Impression and Recommendations:    Lumbar facet joint syndrome Only mild degenerative disc disease but markedly facet arthritis. We did proceed with left and right L4-L5 and L5-S1 facet joint injections, at this point, 3 weeks post procedure he is 90% pain free. He is now a candidate for radiofrequency ablation after successful medial branch blocks should his pain return.  Obstructive uropathy Fantastic response to increasing Flomax 0.8, no changes.  Primary osteoarthritis of first carpometacarpal joint of right hand Right first CMC injection as above, he did get some systemic effect from these facet injection, but still has some pain. Return in one month.

## 2016-07-17 NOTE — Assessment & Plan Note (Signed)
Fantastic response to increasing Flomax 0.8, no changes.

## 2016-07-17 NOTE — Assessment & Plan Note (Signed)
Right first CMC injection as above, he did get some systemic effect from these facet injection, but still has some pain. Return in one month.

## 2016-07-17 NOTE — Assessment & Plan Note (Signed)
Only mild degenerative disc disease but markedly facet arthritis. We did proceed with left and right L4-L5 and L5-S1 facet joint injections, at this point, 3 weeks post procedure he is 90% pain free. He is now a candidate for radiofrequency ablation after successful medial branch blocks should his pain return.

## 2016-07-18 DIAGNOSIS — M47817 Spondylosis without myelopathy or radiculopathy, lumbosacral region: Secondary | ICD-10-CM | POA: Diagnosis not present

## 2016-07-18 DIAGNOSIS — M546 Pain in thoracic spine: Secondary | ICD-10-CM | POA: Diagnosis not present

## 2016-07-18 DIAGNOSIS — M5387 Other specified dorsopathies, lumbosacral region: Secondary | ICD-10-CM | POA: Diagnosis not present

## 2016-07-18 DIAGNOSIS — M4607 Spinal enthesopathy, lumbosacral region: Secondary | ICD-10-CM | POA: Diagnosis not present

## 2016-07-18 DIAGNOSIS — M5442 Lumbago with sciatica, left side: Secondary | ICD-10-CM | POA: Diagnosis not present

## 2016-07-18 DIAGNOSIS — M5137 Other intervertebral disc degeneration, lumbosacral region: Secondary | ICD-10-CM | POA: Diagnosis not present

## 2016-07-22 DIAGNOSIS — M5442 Lumbago with sciatica, left side: Secondary | ICD-10-CM | POA: Diagnosis not present

## 2016-07-22 DIAGNOSIS — M47817 Spondylosis without myelopathy or radiculopathy, lumbosacral region: Secondary | ICD-10-CM | POA: Diagnosis not present

## 2016-07-22 DIAGNOSIS — M5137 Other intervertebral disc degeneration, lumbosacral region: Secondary | ICD-10-CM | POA: Diagnosis not present

## 2016-07-22 DIAGNOSIS — M5383 Other specified dorsopathies, cervicothoracic region: Secondary | ICD-10-CM | POA: Diagnosis not present

## 2016-07-22 DIAGNOSIS — M546 Pain in thoracic spine: Secondary | ICD-10-CM | POA: Diagnosis not present

## 2016-07-22 DIAGNOSIS — M4603 Spinal enthesopathy, cervicothoracic region: Secondary | ICD-10-CM | POA: Diagnosis not present

## 2016-07-23 DIAGNOSIS — M62830 Muscle spasm of back: Secondary | ICD-10-CM | POA: Diagnosis not present

## 2016-07-23 DIAGNOSIS — M546 Pain in thoracic spine: Secondary | ICD-10-CM | POA: Diagnosis not present

## 2016-07-23 DIAGNOSIS — M5442 Lumbago with sciatica, left side: Secondary | ICD-10-CM | POA: Diagnosis not present

## 2016-07-23 DIAGNOSIS — M5387 Other specified dorsopathies, lumbosacral region: Secondary | ICD-10-CM | POA: Diagnosis not present

## 2016-07-23 DIAGNOSIS — M47817 Spondylosis without myelopathy or radiculopathy, lumbosacral region: Secondary | ICD-10-CM | POA: Diagnosis not present

## 2016-07-23 DIAGNOSIS — M5137 Other intervertebral disc degeneration, lumbosacral region: Secondary | ICD-10-CM | POA: Diagnosis not present

## 2016-07-23 DIAGNOSIS — M4607 Spinal enthesopathy, lumbosacral region: Secondary | ICD-10-CM | POA: Diagnosis not present

## 2016-07-25 DIAGNOSIS — M5442 Lumbago with sciatica, left side: Secondary | ICD-10-CM | POA: Diagnosis not present

## 2016-07-25 DIAGNOSIS — M4603 Spinal enthesopathy, cervicothoracic region: Secondary | ICD-10-CM | POA: Diagnosis not present

## 2016-07-25 DIAGNOSIS — M546 Pain in thoracic spine: Secondary | ICD-10-CM | POA: Diagnosis not present

## 2016-07-25 DIAGNOSIS — M5383 Other specified dorsopathies, cervicothoracic region: Secondary | ICD-10-CM | POA: Diagnosis not present

## 2016-07-25 DIAGNOSIS — M47817 Spondylosis without myelopathy or radiculopathy, lumbosacral region: Secondary | ICD-10-CM | POA: Diagnosis not present

## 2016-07-25 DIAGNOSIS — M5137 Other intervertebral disc degeneration, lumbosacral region: Secondary | ICD-10-CM | POA: Diagnosis not present

## 2016-07-25 DIAGNOSIS — M62830 Muscle spasm of back: Secondary | ICD-10-CM | POA: Diagnosis not present

## 2016-07-29 DIAGNOSIS — M47817 Spondylosis without myelopathy or radiculopathy, lumbosacral region: Secondary | ICD-10-CM | POA: Diagnosis not present

## 2016-07-29 DIAGNOSIS — M546 Pain in thoracic spine: Secondary | ICD-10-CM | POA: Diagnosis not present

## 2016-07-29 DIAGNOSIS — M5383 Other specified dorsopathies, cervicothoracic region: Secondary | ICD-10-CM | POA: Diagnosis not present

## 2016-07-29 DIAGNOSIS — M5137 Other intervertebral disc degeneration, lumbosacral region: Secondary | ICD-10-CM | POA: Diagnosis not present

## 2016-07-29 DIAGNOSIS — M4603 Spinal enthesopathy, cervicothoracic region: Secondary | ICD-10-CM | POA: Diagnosis not present

## 2016-07-29 DIAGNOSIS — M5442 Lumbago with sciatica, left side: Secondary | ICD-10-CM | POA: Diagnosis not present

## 2016-07-30 DIAGNOSIS — M4607 Spinal enthesopathy, lumbosacral region: Secondary | ICD-10-CM | POA: Diagnosis not present

## 2016-07-30 DIAGNOSIS — M545 Low back pain: Secondary | ICD-10-CM | POA: Diagnosis not present

## 2016-07-30 DIAGNOSIS — M62830 Muscle spasm of back: Secondary | ICD-10-CM | POA: Diagnosis not present

## 2016-07-30 DIAGNOSIS — M546 Pain in thoracic spine: Secondary | ICD-10-CM | POA: Diagnosis not present

## 2016-07-30 DIAGNOSIS — M542 Cervicalgia: Secondary | ICD-10-CM | POA: Diagnosis not present

## 2016-07-30 DIAGNOSIS — M5387 Other specified dorsopathies, lumbosacral region: Secondary | ICD-10-CM | POA: Diagnosis not present

## 2016-08-05 DIAGNOSIS — M542 Cervicalgia: Secondary | ICD-10-CM | POA: Diagnosis not present

## 2016-08-05 DIAGNOSIS — M546 Pain in thoracic spine: Secondary | ICD-10-CM | POA: Diagnosis not present

## 2016-08-05 DIAGNOSIS — M4607 Spinal enthesopathy, lumbosacral region: Secondary | ICD-10-CM | POA: Diagnosis not present

## 2016-08-05 DIAGNOSIS — M5387 Other specified dorsopathies, lumbosacral region: Secondary | ICD-10-CM | POA: Diagnosis not present

## 2016-08-05 DIAGNOSIS — M545 Low back pain: Secondary | ICD-10-CM | POA: Diagnosis not present

## 2016-08-05 DIAGNOSIS — M62830 Muscle spasm of back: Secondary | ICD-10-CM | POA: Diagnosis not present

## 2016-08-06 DIAGNOSIS — M546 Pain in thoracic spine: Secondary | ICD-10-CM | POA: Diagnosis not present

## 2016-08-06 DIAGNOSIS — M545 Low back pain: Secondary | ICD-10-CM | POA: Diagnosis not present

## 2016-08-06 DIAGNOSIS — M62830 Muscle spasm of back: Secondary | ICD-10-CM | POA: Diagnosis not present

## 2016-08-06 DIAGNOSIS — M542 Cervicalgia: Secondary | ICD-10-CM | POA: Diagnosis not present

## 2016-08-08 DIAGNOSIS — M545 Low back pain: Secondary | ICD-10-CM | POA: Diagnosis not present

## 2016-08-08 DIAGNOSIS — M542 Cervicalgia: Secondary | ICD-10-CM | POA: Diagnosis not present

## 2016-08-08 DIAGNOSIS — M62830 Muscle spasm of back: Secondary | ICD-10-CM | POA: Diagnosis not present

## 2016-08-08 DIAGNOSIS — M546 Pain in thoracic spine: Secondary | ICD-10-CM | POA: Diagnosis not present

## 2016-08-12 DIAGNOSIS — M5383 Other specified dorsopathies, cervicothoracic region: Secondary | ICD-10-CM | POA: Diagnosis not present

## 2016-08-12 DIAGNOSIS — M542 Cervicalgia: Secondary | ICD-10-CM | POA: Diagnosis not present

## 2016-08-12 DIAGNOSIS — M62830 Muscle spasm of back: Secondary | ICD-10-CM | POA: Diagnosis not present

## 2016-08-12 DIAGNOSIS — M546 Pain in thoracic spine: Secondary | ICD-10-CM | POA: Diagnosis not present

## 2016-08-12 DIAGNOSIS — M545 Low back pain: Secondary | ICD-10-CM | POA: Diagnosis not present

## 2016-08-12 DIAGNOSIS — M4603 Spinal enthesopathy, cervicothoracic region: Secondary | ICD-10-CM | POA: Diagnosis not present

## 2016-08-13 DIAGNOSIS — M546 Pain in thoracic spine: Secondary | ICD-10-CM | POA: Diagnosis not present

## 2016-08-13 DIAGNOSIS — M542 Cervicalgia: Secondary | ICD-10-CM | POA: Diagnosis not present

## 2016-08-13 DIAGNOSIS — M4607 Spinal enthesopathy, lumbosacral region: Secondary | ICD-10-CM | POA: Diagnosis not present

## 2016-08-13 DIAGNOSIS — M5387 Other specified dorsopathies, lumbosacral region: Secondary | ICD-10-CM | POA: Diagnosis not present

## 2016-08-13 DIAGNOSIS — M62830 Muscle spasm of back: Secondary | ICD-10-CM | POA: Diagnosis not present

## 2016-08-13 DIAGNOSIS — M545 Low back pain: Secondary | ICD-10-CM | POA: Diagnosis not present

## 2016-08-15 DIAGNOSIS — M542 Cervicalgia: Secondary | ICD-10-CM | POA: Diagnosis not present

## 2016-08-15 DIAGNOSIS — M546 Pain in thoracic spine: Secondary | ICD-10-CM | POA: Diagnosis not present

## 2016-08-15 DIAGNOSIS — M5387 Other specified dorsopathies, lumbosacral region: Secondary | ICD-10-CM | POA: Diagnosis not present

## 2016-08-15 DIAGNOSIS — M545 Low back pain: Secondary | ICD-10-CM | POA: Diagnosis not present

## 2016-08-15 DIAGNOSIS — M62830 Muscle spasm of back: Secondary | ICD-10-CM | POA: Diagnosis not present

## 2016-08-15 DIAGNOSIS — M4607 Spinal enthesopathy, lumbosacral region: Secondary | ICD-10-CM | POA: Diagnosis not present

## 2016-08-16 ENCOUNTER — Ambulatory Visit (INDEPENDENT_AMBULATORY_CARE_PROVIDER_SITE_OTHER): Payer: BLUE CROSS/BLUE SHIELD

## 2016-08-16 ENCOUNTER — Ambulatory Visit (INDEPENDENT_AMBULATORY_CARE_PROVIDER_SITE_OTHER): Payer: BLUE CROSS/BLUE SHIELD | Admitting: Sports Medicine

## 2016-08-16 ENCOUNTER — Encounter: Payer: Self-pay | Admitting: Sports Medicine

## 2016-08-16 DIAGNOSIS — M19071 Primary osteoarthritis, right ankle and foot: Secondary | ICD-10-CM | POA: Insufficient documentation

## 2016-08-16 DIAGNOSIS — X58XXXS Exposure to other specified factors, sequela: Secondary | ICD-10-CM

## 2016-08-16 DIAGNOSIS — M79671 Pain in right foot: Secondary | ICD-10-CM | POA: Diagnosis not present

## 2016-08-16 DIAGNOSIS — S92324S Nondisplaced fracture of second metatarsal bone, right foot, sequela: Secondary | ICD-10-CM

## 2016-08-16 HISTORY — DX: Primary osteoarthritis, right ankle and foot: M19.071

## 2016-08-16 NOTE — Progress Notes (Signed)
  Subjective:    CC: Right foot pain  HPI: For the past several months this pleasant 46 year old male has had pain that he localizes on the dorsal midfoot of his right foot, proximal to the second metatarsal. Symptoms are moderate, persistent.  Past medical history:  Negative.  See flowsheet/record as well for more information.  Surgical history: Negative.  See flowsheet/record as well for more information.  Family history: Negative.  See flowsheet/record as well for more information.  Social history: Negative.  See flowsheet/record as well for more information.  Allergies, and medications have been entered into the medical record, reviewed, and no changes needed.   Review of Systems: No fevers, chills, night sweats, weight loss, chest pain, or shortness of breath.   Objective:    General: Well Developed, well nourished, and in no acute distress.  Neuro: Alert and oriented x3, extra-ocular muscles intact, sensation grossly intact.  HEENT: Normocephalic, atraumatic, pupils equal round reactive to light, neck supple, no masses, no lymphadenopathy, thyroid nonpalpable.  Skin: Warm and dry, no rashes. Cardiac: Regular rate and rhythm, no murmurs rubs or gallops, no lower extremity edema.  Respiratory: Clear to auscultation bilaterally. Not using accessory muscles, speaking in full sentences. Right Foot: No visible erythema or swelling. Range of motion is full in all directions. Strength is 5/5 in all directions. No hallux valgus. No pes cavus or pes planus. No abnormal callus noted. No pain over the navicular prominence, or base of fifth metatarsal. No tenderness to palpation of the calcaneal insertion of plantar fascia. No pain at the Achilles insertion. No pain over the calcaneal bursa. No pain of the retrocalcaneal bursa. No tenderness to palpation over the tarsals, metatarsals, or phalanges. Tender to palpation proximal to the second metatarsal shaft at the tarsometatarsal  joint. No hallux rigidus or limitus. No tenderness palpation over interphalangeal joints. No pain with compression of the metatarsal heads. Neurovascularly intact distally.  Procedure: Real-time Ultrasound Guided Injection of tarsometatarsal joint at the base of the second and third metatarsals Device: GE Logiq E  Verbal informed consent obtained.  Time-out conducted.  Noted no overlying erythema, induration, or other signs of local infection.  Skin prepped in a sterile fashion.  Local anesthesia: Topical Ethyl chloride.  With sterile technique and under real time ultrasound guidance:  1 mL kenalog 40, 1 mL lidocaine injected easily Completed without difficulty  Pain immediately resolved suggesting accurate placement of the medication.  Advised to call if fevers/chills, erythema, induration, drainage, or persistent bleeding.  Images permanently stored and available for review in the ultrasound unit.  Impression: Technically successful ultrasound guided injection.  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  Impression and Recommendations:    Primary osteoarthritis of right foot Pain predominantly at the Lisfranc joint at the base of the second and third metatarsals. Has failed oral anti-inflammatories and supportive footwear. Injection as above, x-rays, return in one month.  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

## 2016-08-16 NOTE — Assessment & Plan Note (Addendum)
Pain predominantly at the Lisfranc joint at the base of the second and third metatarsals. Has failed oral anti-inflammatories and supportive footwear. Injection as above, x-rays, return in one month.  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

## 2016-08-21 ENCOUNTER — Telehealth: Payer: Self-pay

## 2016-08-21 NOTE — Telephone Encounter (Signed)
Pt left VM asking is pressure from previous fracture is causing pressure and keeping current fracture from healing. Please advise.

## 2016-08-21 NOTE — Telephone Encounter (Signed)
No, the old fracture is just there, if the pain doesn't improve from the injection then it is most likely coming from the old fracture in the metatarsal and we will need to consider surgical intervention.

## 2016-08-22 DIAGNOSIS — M542 Cervicalgia: Secondary | ICD-10-CM | POA: Diagnosis not present

## 2016-08-22 DIAGNOSIS — M545 Low back pain: Secondary | ICD-10-CM | POA: Diagnosis not present

## 2016-08-22 DIAGNOSIS — M62830 Muscle spasm of back: Secondary | ICD-10-CM | POA: Diagnosis not present

## 2016-08-28 ENCOUNTER — Other Ambulatory Visit: Payer: Self-pay | Admitting: Sports Medicine

## 2016-08-28 DIAGNOSIS — S86311S Strain of muscle(s) and tendon(s) of peroneal muscle group at lower leg level, right leg, sequela: Secondary | ICD-10-CM

## 2016-09-09 DIAGNOSIS — M79671 Pain in right foot: Secondary | ICD-10-CM | POA: Diagnosis not present

## 2016-09-09 DIAGNOSIS — M7741 Metatarsalgia, right foot: Secondary | ICD-10-CM | POA: Diagnosis not present

## 2016-09-09 DIAGNOSIS — M84374A Stress fracture, right foot, initial encounter for fracture: Secondary | ICD-10-CM | POA: Diagnosis not present

## 2016-09-26 ENCOUNTER — Other Ambulatory Visit: Payer: Self-pay | Admitting: Sports Medicine

## 2016-09-26 DIAGNOSIS — N139 Obstructive and reflux uropathy, unspecified: Secondary | ICD-10-CM

## 2016-10-01 DIAGNOSIS — Z967 Presence of other bone and tendon implants: Secondary | ICD-10-CM | POA: Diagnosis not present

## 2016-10-01 DIAGNOSIS — T8484XA Pain due to internal orthopedic prosthetic devices, implants and grafts, initial encounter: Secondary | ICD-10-CM | POA: Diagnosis not present

## 2016-10-01 DIAGNOSIS — M216X1 Other acquired deformities of right foot: Secondary | ICD-10-CM | POA: Diagnosis not present

## 2016-10-01 DIAGNOSIS — M7741 Metatarsalgia, right foot: Secondary | ICD-10-CM | POA: Diagnosis not present

## 2016-11-13 DIAGNOSIS — M79671 Pain in right foot: Secondary | ICD-10-CM | POA: Diagnosis not present

## 2016-12-10 DIAGNOSIS — M1811 Unilateral primary osteoarthritis of first carpometacarpal joint, right hand: Secondary | ICD-10-CM | POA: Diagnosis not present

## 2016-12-30 ENCOUNTER — Other Ambulatory Visit: Payer: Self-pay | Admitting: Sports Medicine

## 2016-12-30 DIAGNOSIS — S86311S Strain of muscle(s) and tendon(s) of peroneal muscle group at lower leg level, right leg, sequela: Secondary | ICD-10-CM

## 2017-01-21 DIAGNOSIS — M1811 Unilateral primary osteoarthritis of first carpometacarpal joint, right hand: Secondary | ICD-10-CM | POA: Diagnosis not present

## 2017-02-01 ENCOUNTER — Other Ambulatory Visit: Payer: Self-pay | Admitting: Sports Medicine

## 2017-02-01 DIAGNOSIS — N139 Obstructive and reflux uropathy, unspecified: Secondary | ICD-10-CM

## 2017-04-08 DIAGNOSIS — M4696 Unspecified inflammatory spondylopathy, lumbar region: Secondary | ICD-10-CM | POA: Diagnosis not present

## 2017-04-08 DIAGNOSIS — M47812 Spondylosis without myelopathy or radiculopathy, cervical region: Secondary | ICD-10-CM | POA: Diagnosis not present

## 2017-04-08 DIAGNOSIS — Z982 Presence of cerebrospinal fluid drainage device: Secondary | ICD-10-CM | POA: Diagnosis not present

## 2017-04-08 DIAGNOSIS — Q046 Congenital cerebral cysts: Secondary | ICD-10-CM | POA: Diagnosis not present

## 2017-04-14 ENCOUNTER — Ambulatory Visit (INDEPENDENT_AMBULATORY_CARE_PROVIDER_SITE_OTHER): Payer: BLUE CROSS/BLUE SHIELD | Admitting: Sports Medicine

## 2017-04-14 ENCOUNTER — Encounter: Payer: Self-pay | Admitting: Sports Medicine

## 2017-04-14 DIAGNOSIS — M47816 Spondylosis without myelopathy or radiculopathy, lumbar region: Secondary | ICD-10-CM | POA: Diagnosis not present

## 2017-04-14 MED ORDER — OXYCODONE HCL 10 MG PO TABS: 10.0000 mg | ORAL_TABLET | Freq: Three times a day (TID) | ORAL | 0 refills | Status: DC | PRN

## 2017-04-14 NOTE — Assessment & Plan Note (Signed)
Did fantastic last year in December with bilateral L4-S1 facet joint injections. He is interested in proceeding with medial branch blocks and if effective radiofrequency ablation of his left and right L4-S1 facet joints. Short supply of oxycodone for pain.

## 2017-04-14 NOTE — Progress Notes (Signed)
  Subjective:    CC: Back pain  HPI: Frederick Ward is a pleasant 46 year old male, he has known lumbar spondylosis, facet syndrome, approximately a year ago he had bilateral L4-S1 facet joint injections that provided nearly one year of relief, he is having a recurrence of the same pain and desires to proceed with medial branch blocks and radiofrequency ablation for longer duration of action. Pain is severe, localized with radiation to the thigh, seems to be alternating left and right. No bowel or bladder dysfunction, saddle numbness, no radicular pain.  Past medical history:  Negative.  See flowsheet/record as well for more information.  Surgical history: Negative.  See flowsheet/record as well for more information.  Family history: Negative.  See flowsheet/record as well for more information.  Social history: Negative.  See flowsheet/record as well for more information.  Allergies, and medications have been entered into the medical record, reviewed, and no changes needed.   Review of Systems: No fevers, chills, night sweats, weight loss, chest pain, or shortness of breath.   Objective:    General: Well Developed, well nourished, and in no acute distress.  Neuro: Alert and oriented x3, extra-ocular muscles intact, sensation grossly intact.  HEENT: Normocephalic, atraumatic, pupils equal round reactive to light, neck supple, no masses, no lymphadenopathy, thyroid nonpalpable.  Skin: Warm and dry, no rashes. Cardiac: Regular rate and rhythm, no murmurs rubs or gallops, no lower extremity edema.  Respiratory: Clear to auscultation bilaterally. Not using accessory muscles, speaking in full sentences. Back Exam:  Inspection: Unremarkable  Motion: Flexion 45 deg, Extension 45 deg, Side Bending to 45 deg bilaterally,  Rotation to 45 deg bilaterally  SLR laying: Negative  XSLR laying: Negative  Palpable tenderness: None. FABER: negative. Sensory change: Gross sensation intact to all lumbar and sacral  dermatomes.  Reflexes: 2+ at both patellar tendons, 2+ at achilles tendons, Babinski's downgoing.  Strength at foot  Plantar-flexion: 5/5 Dorsi-flexion: 5/5 Eversion: 5/5 Inversion: 5/5  Leg strength  Quad: 5/5 Hamstring: 5/5 Hip flexor: 5/5 Hip abductors: 5/5  Gait unremarkable.  Impression and Recommendations:    Lumbar facet joint syndrome Did fantastic last year in December with bilateral L4-S1 facet joint injections. He is interested in proceeding with medial branch blocks and if effective radiofrequency ablation of his left and right L4-S1 facet joints. Short supply of oxycodone for pain. ___________________________________________ [name]. Benjamin Stain, M.D., ABFM., CAQSM. Primary Care and Sports Medicine [facility]  Adjunct Instructor of Family Medicine  [employer]

## 2017-04-18 ENCOUNTER — Other Ambulatory Visit: Payer: Self-pay | Admitting: Sports Medicine

## 2017-04-18 DIAGNOSIS — M47816 Spondylosis without myelopathy or radiculopathy, lumbar region: Secondary | ICD-10-CM

## 2017-04-29 ENCOUNTER — Telehealth: Payer: Self-pay | Admitting: Sports Medicine

## 2017-04-29 DIAGNOSIS — M47816 Spondylosis without myelopathy or radiculopathy, lumbar region: Secondary | ICD-10-CM

## 2017-04-29 MED ORDER — OXYCODONE HCL 10 MG PO TABS: 10.0000 mg | ORAL_TABLET | Freq: Three times a day (TID) | ORAL | 0 refills | Status: DC | PRN

## 2017-04-29 NOTE — Telephone Encounter (Signed)
Pt advised. Verbalized understanding.

## 2017-04-29 NOTE — Telephone Encounter (Signed)
Pt called clinic to request refill on oxycodone. States they could not get him in for an epidural injection until next week. Will route.

## 2017-04-29 NOTE — Telephone Encounter (Signed)
LAST refill..no mas.

## 2017-04-30 DIAGNOSIS — M19049 Primary osteoarthritis, unspecified hand: Secondary | ICD-10-CM | POA: Diagnosis not present

## 2017-04-30 DIAGNOSIS — M79645 Pain in left finger(s): Secondary | ICD-10-CM | POA: Diagnosis not present

## 2017-05-05 ENCOUNTER — Ambulatory Visit
Admission: RE | Admit: 2017-05-05 | Discharge: 2017-05-05 | Disposition: A | Discharge status: Home / Self Care | Payer: BLUE CROSS/BLUE SHIELD | Source: Ambulatory Visit | Attending: Sports Medicine | Admitting: Sports Medicine

## 2017-05-05 ENCOUNTER — Other Ambulatory Visit: Payer: Self-pay | Admitting: Sports Medicine

## 2017-05-05 DIAGNOSIS — M47816 Spondylosis without myelopathy or radiculopathy, lumbar region: Secondary | ICD-10-CM

## 2017-05-05 DIAGNOSIS — N139 Obstructive and reflux uropathy, unspecified: Secondary | ICD-10-CM

## 2017-05-11 ENCOUNTER — Other Ambulatory Visit: Payer: Self-pay | Admitting: Sports Medicine

## 2017-05-11 DIAGNOSIS — S86311S Strain of muscle(s) and tendon(s) of peroneal muscle group at lower leg level, right leg, sequela: Secondary | ICD-10-CM

## 2017-06-19 DIAGNOSIS — D234 Other benign neoplasm of skin of scalp and neck: Secondary | ICD-10-CM | POA: Diagnosis not present

## 2017-06-19 DIAGNOSIS — L814 Other melanin hyperpigmentation: Secondary | ICD-10-CM | POA: Diagnosis not present

## 2017-06-19 DIAGNOSIS — L821 Other seborrheic keratosis: Secondary | ICD-10-CM | POA: Diagnosis not present

## 2017-07-01 DIAGNOSIS — M1811 Unilateral primary osteoarthritis of first carpometacarpal joint, right hand: Secondary | ICD-10-CM | POA: Diagnosis not present

## 2017-07-01 DIAGNOSIS — M19049 Primary osteoarthritis, unspecified hand: Secondary | ICD-10-CM | POA: Diagnosis not present

## 2017-07-09 ENCOUNTER — Other Ambulatory Visit: Payer: Self-pay | Admitting: Sports Medicine

## 2017-07-09 DIAGNOSIS — S86311S Strain of muscle(s) and tendon(s) of peroneal muscle group at lower leg level, right leg, sequela: Secondary | ICD-10-CM

## 2017-07-09 DIAGNOSIS — N139 Obstructive and reflux uropathy, unspecified: Secondary | ICD-10-CM

## 2017-07-11 DIAGNOSIS — Z96691 Finger-joint replacement of right hand: Secondary | ICD-10-CM | POA: Diagnosis not present

## 2017-07-11 DIAGNOSIS — M79644 Pain in right finger(s): Secondary | ICD-10-CM | POA: Diagnosis not present

## 2017-07-11 DIAGNOSIS — Z471 Aftercare following joint replacement surgery: Secondary | ICD-10-CM | POA: Diagnosis not present

## 2017-08-08 DIAGNOSIS — M19049 Primary osteoarthritis, unspecified hand: Secondary | ICD-10-CM | POA: Diagnosis not present

## 2017-08-19 ENCOUNTER — Encounter: Payer: Self-pay | Admitting: Sports Medicine

## 2017-08-19 ENCOUNTER — Ambulatory Visit: Payer: BLUE CROSS/BLUE SHIELD | Admitting: Sports Medicine

## 2017-08-19 VITALS — BP 124/72 | HR 94 | Wt 174.0 lb

## 2017-08-19 DIAGNOSIS — I2089 Other forms of angina pectoris: Secondary | ICD-10-CM

## 2017-08-19 DIAGNOSIS — I208 Other forms of angina pectoris: Secondary | ICD-10-CM | POA: Insufficient documentation

## 2017-08-19 DIAGNOSIS — F17200 Nicotine dependence, unspecified, uncomplicated: Secondary | ICD-10-CM

## 2017-08-19 DIAGNOSIS — Z87891 Personal history of nicotine dependence: Secondary | ICD-10-CM | POA: Insufficient documentation

## 2017-08-19 DIAGNOSIS — Z23 Encounter for immunization: Secondary | ICD-10-CM | POA: Diagnosis not present

## 2017-08-19 HISTORY — DX: Other forms of angina pectoris: I20.8

## 2017-08-19 HISTORY — DX: Other forms of angina pectoris: I20.89

## 2017-08-19 HISTORY — DX: Nicotine dependence, unspecified, uncomplicated: F17.200

## 2017-08-19 MED ORDER — METOPROLOL SUCCINATE ER 50 MG PO TB24: 50.0000 mg | ORAL_TABLET | Freq: Every day | ORAL | 3 refills | Status: DC

## 2017-08-19 MED ORDER — VARENICLINE TARTRATE 1 MG PO TABS: 1.0000 mg | ORAL_TABLET | Freq: Two times a day (BID) | ORAL | 3 refills | Status: DC

## 2017-08-19 MED ORDER — NITROGLYCERIN 0.4 MG SL SUBL: 0.4000 mg | SUBLINGUAL_TABLET | SUBLINGUAL | 0 refills | Status: DC | PRN

## 2017-08-19 MED ORDER — ASPIRIN EC 81 MG PO TBEC
81.0000 mg | DELAYED_RELEASE_TABLET | Freq: Every day | ORAL | 3 refills | Status: DC
Start: 2017-08-19 — End: 2017-09-23

## 2017-08-19 MED ORDER — VARENICLINE TARTRATE 0.5 MG X 11 & 1 MG X 42 PO MISC: ORAL | 0 refills | Status: DC

## 2017-08-19 NOTE — Assessment & Plan Note (Signed)
Starting Chantix. 

## 2017-08-19 NOTE — Assessment & Plan Note (Addendum)
I have advised him of the seriousness of this, and recommended urgent cardiology referral for stress testing versus going straight to cardiac catheterization. In the meantime I am going to add aspirin, Toprol-XL 50 mg daily, risk stratification with lab work, urgent cardiology referral. Nitroglycerin. Advised to call 911 if after 3 nitroglycerin he continues to have chest pain.

## 2017-08-19 NOTE — Progress Notes (Signed)
Subjective:    CC: Couple of issues  HPI: Chest tightness: Unfortunately Frederick Ward started smoking again, over the past couple of weeks he is noted some chest tightness with exertion, substernal, no radiation, no diaphoresis, no palpitations, no presyncope.  With rest the chest tightness resolved.  He did get some tightness after going up a couple of flights of steps.  He would like some help with smoking cessation, I did advise that we go ahead and get him into cardiology tomorrow morning, he does have some work to do, understands the risks.  He agrees to get in sometime this week.  Risks, benefits, alternatives explained.  CMC arthritis right: Is now post thumb suspension with tendon interposition, doing well.  I reviewed the past medical history, family history, social history, surgical history, and allergies today and no changes were needed.  Please see the problem list section below in epic for further details.  Past Medical History: Past Medical History:  Diagnosis Date  . Pain    LEFT SHOULDER- ROTATOR CUFF  TEAR  . 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 Yellow jacket sting allergy    Past Surgical History: Past Surgical History:  Procedure Laterality Date  . ANTERIOR CRUCIATE LIGAMENT REPAIR  1997 OR 1998   LEFT KNEE  . LEFT ANKLE TENDON TRANSFER  FEB 2010  . RIGHT ANKLE TENDON TRANSFER  SEPT 30, 2013   SURGERY WAS DONE IN [facility]--PT WEARS  ORTHOTIC BOOT WHEN AMBULATING--HEALING INCISION--SOME WEAKNESS STILL  . RIGHT WRIST FIXATION AUG  2006    . SHOULDER ARTHROSCOPY WITH ROTATOR CUFF REPAIR AND SUBACROMIAL DECOMPRESSION  06/18/2012   Procedure: SHOULDER ARTHROSCOPY WITH ROTATOR CUFF REPAIR AND SUBACROMIAL DECOMPRESSION;  Surgeon: Javier DockerJeffrey C Beane, MD;  Location: [facility];  Service: Orthopedics;  Laterality: Left;  Left Shoulder arthroscopy subacromial decompression mini  open rotator cuff repair   . VENTRICULOPERITONEAL SHUNT     Social History: Social History   Socioeconomic History  . Marital status: Married    Spouse name: None  . Number of children: None  . Years of education: None  . Highest education level: None  Social Needs  . Financial resource strain: None  . Food insecurity - worry: None  . Food insecurity - inability: None  . Transportation needs - medical: None  . Transportation needs - non-medical: None  Occupational History  . None  Tobacco Use  . Smoking status: Former Smoker    Packs/day: 1.00    Years: 20.00    Pack years: 20.00    Types: Cigarettes  . Smokeless tobacco: Never Used  . Tobacco comment: QUIT SMOKING 2011  Substance and Sexual Activity  . Alcohol use: No  . Drug use: No  . Sexual activity: None  Other Topics Concern  . None  Social History Narrative  . None   Family History: Family History  Problem Relation Age of Onset  . Diabetes Father    Allergies: Allergies  Allergen Reactions  . Bee Venom Other (See Comments)    Numbness *fell x2 and muscles wouldn't work well, 21 Mar 2005   Medications: See med rec.  Review of Systems: No fevers, chills, night sweats, weight loss, chest pain, or shortness of breath.   Objective:    General: Well Developed, well nourished, and in no acute distress.  Neuro: Alert and oriented x3, extra-ocular muscles intact, sensation grossly intact.  HEENT: Normocephalic, atraumatic, pupils equal round reactive to light, neck supple, no masses, no lymphadenopathy, thyroid nonpalpable.  Skin: Warm and dry, no rashes. Cardiac: Regular rate and rhythm, no murmurs rubs or gallops, no lower extremity edema.  Respiratory: Clear to auscultation bilaterally. Not using accessory muscles, speaking in full sentences.  Impression and Recommendations:    Stable angina (HCC) I have advised him of the seriousness of this, and recommended urgent cardiology referral for stress  testing versus going straight to cardiac catheterization. In the meantime I am going to add aspirin, Toprol-XL 50 mg daily, risk stratification with lab work, urgent cardiology referral. Nitroglycerin. Advised to call 911 if after 3 nitroglycerin he continues to have chest pain.   Smoker Starting Chantix.  I spent 25 minutes with this patient, greater than 50% was face-to-face time counseling regarding the above diagnoses ___________________________________________ [name]. Benjamin Stain, M.D., ABFM., CAQSM. Primary Care and Sports Medicine [facility]  Adjunct Instructor of Family Medicine  [employer]

## 2017-08-20 DIAGNOSIS — Z23 Encounter for immunization: Secondary | ICD-10-CM

## 2017-08-20 DIAGNOSIS — I208 Other forms of angina pectoris: Secondary | ICD-10-CM | POA: Diagnosis not present

## 2017-08-20 LAB — COMPREHENSIVE METABOLIC PANEL WITH GFR
AG Ratio: 1.8 (calc) (ref 1.0–2.5)
AST: 16 U/L (ref 10–40)
CO2: 24 mmol/L (ref 20–32)
Chloride: 99 mmol/L (ref 98–110)
Glucose, Bld: 211 mg/dL — ABNORMAL HIGH (ref 65–99)
Potassium: 3.9 mmol/L (ref 3.5–5.3)
Sodium: 136 mmol/L (ref 135–146)
Total Protein: 7.2 g/dL (ref 6.1–8.1)

## 2017-08-20 LAB — HEMOGLOBIN A1C
Hgb A1c MFr Bld: 5.6 % of total Hgb (ref <5.7)
Mean Plasma Glucose: 114 (calc)
eAG (mmol/L): 6.3 (calc)

## 2017-08-20 LAB — COMPREHENSIVE METABOLIC PANEL
ALT: 17 U/L (ref 9–46)
Albumin: 4.6 g/dL (ref 3.6–5.1)
Alkaline phosphatase (APISO): 71 U/L (ref 40–115)
BUN: 16 mg/dL (ref 7–25)
Calcium: 10.1 mg/dL (ref 8.6–10.3)
Creat: 1.02 mg/dL (ref 0.60–1.35)
Globulin: 2.6 g/dL (calc) (ref 1.9–3.7)
Total Bilirubin: 0.6 mg/dL (ref 0.2–1.2)

## 2017-08-20 LAB — LIPID PANEL W/REFLEX DIRECT LDL
Cholesterol: 132 mg/dL (ref <200)
HDL: 39 mg/dL — ABNORMAL LOW (ref >40)
LDL Cholesterol (Calc): 74 mg/dL
Non-HDL Cholesterol (Calc): 93 mg/dL (ref <130)
Total CHOL/HDL Ratio: 3.4 (calc) (ref <5.0)
Triglycerides: 108 mg/dL (ref <150)

## 2017-08-20 LAB — CBC
HCT: 44.1 % (ref 38.5–50.0)
Hemoglobin: 15.6 g/dL (ref 13.2–17.1)
MCH: 32.4 pg (ref 27.0–33.0)
MCHC: 35.4 g/dL (ref 32.0–36.0)
MCV: 91.5 fL (ref 80.0–100.0)
MPV: 11.1 fL (ref 7.5–12.5)
Platelets: 268 Thousand/uL (ref 140–400)
RBC: 4.82 Million/uL (ref 4.20–5.80)
RDW: 12.7 % (ref 11.0–15.0)
WBC: 9.2 10*3/uL (ref 3.8–10.8)

## 2017-08-20 LAB — TSH: TSH: 0.97 mIU/L (ref 0.40–4.50)

## 2017-08-25 DIAGNOSIS — R079 Chest pain, unspecified: Secondary | ICD-10-CM

## 2017-08-25 HISTORY — DX: Chest pain, unspecified: R07.9

## 2017-08-25 NOTE — Progress Notes (Signed)
Cardiology Office Note:    Date:  08/26/2017   ID:  Frederick Ward, DOB 1970-12-15, MRN [id number]  PCP:  Monica Becton, MD  Cardiologist:  Norman Herrlich, MD   Referring MD: Monica Becton,*  ASSESSMENT:    1. Chest pain, unspecified type   2. Hyperlipidemia, unspecified hyperlipidemia type    PLAN:    In order of problems listed above:  1. He has a pattern of stable angina.  We will continue current medical treatment aspirin beta-blocker and high intensity statin.  For risk stratification will be sent for cardiac CTA was fractional flow reserve to assess whether revascularization is appropriate.  He also has nitroglycerin available to use as needed after the study is performed he will follow-up with me in my office.   2. Stable continue statin.  If CTA confirms CAD and has revascularization I would intensify therapy with a goal LDL less than 50 for residual risk.   Next appointment 4 weeks   Medication Adjustments/Labs and Tests Ordered: Current medicines are reviewed at length with the patient today.  Concerns regarding medicines are outlined above.  No orders of the defined types were placed in this encounter.  No orders of the defined types were placed in this encounter.    Chief Complaint  Patient presents with  . New Patient (Initial Visit)    to evaluate "chest pressure"  and SHOB  . Chest Pain    History of Present Illness:    Frederick Ward is a 47 y.o. male who is being seen today for the evaluation of chest pain  at the request of Monica Becton In retrospect he has been having typical angina for approximately 1-1/2 years.  Onset was in the fall 2017 he noticed immediate vigorous garden work he would develop substernal discomfort he also clenched fist to his chest to describe which would cause him to stop rest and after 2-5 minutes find relief.  The symptoms would recur but he also has had walk-through angina with other activities such as  cutting the grass.  The pattern is not progressed it occurs perhaps once a month but has captured his attention and is concerned about whether he has heart disease and his prognosis especially of the father having bypass surgery at approximately age 12-60.  The symptoms do not occur at rest and always relieved with rest.  No associated shortness of breath nausea vomiting or diaphoresis.  Overall he notices he is fatigued and feels that his exercise tolerance is diminished.  He has been on a statin for hyperlipidemia and recently was placed on a beta-blocker.  He has no known history of congenital or rheumatic heart disease.  Past Medical History:  Diagnosis Date  . Annual physical exam 07/26/2014  . Chest pain 08/25/2017  . History of right peroneus brevis tear post repair 07/26/2014  . Hyperlipidemia 07/26/2014  . Lumbar facet joint syndrome 04/11/2016  . Obstructive uropathy 03/19/2016  . Osteoarthritis of both knees 07/26/2014   Postarthroscopy and partial meniscectomy on the right. Unloader brace on the right. Status post anterior cruciate ligament repair with reconstruction on the left.  Orthovisc approved   . Pain    LEFT SHOULDER- ROTATOR CUFF  TEAR  . Primary osteoarthritis of first carpometacarpal joint of right hand 06/04/2016  . 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 Rotator cuff tear, left 06/18/2012  . S/P ventriculoperitoneal shunt 07/26/2014  . Smoker 08/19/2017  . Stable angina (HCC) 08/19/2017  . 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 Yellow jacket sting allergy     Past Surgical History:  Procedure Laterality Date  . ANTERIOR CRUCIATE LIGAMENT REPAIR  1997 OR 1998   LEFT KNEE  . LEFT ANKLE TENDON TRANSFER  FEB 2010  . RIGHT ANKLE TENDON TRANSFER  SEPT 30, 2013   SURGERY WAS DONE IN [facility]--PT WEARS  ORTHOTIC BOOT WHEN AMBULATING--HEALING INCISION--SOME  WEAKNESS STILL  . RIGHT WRIST FIXATION AUG  2006    . SHOULDER ARTHROSCOPY WITH ROTATOR CUFF REPAIR AND SUBACROMIAL DECOMPRESSION  06/18/2012   Procedure: SHOULDER ARTHROSCOPY WITH ROTATOR CUFF REPAIR AND SUBACROMIAL DECOMPRESSION;  Surgeon: Javier Docker, MD;  Location: [facility];  Service: Orthopedics;  Laterality: Left;  Left Shoulder arthroscopy subacromial decompression mini open rotator cuff repair   . VENTRICULOPERITONEAL SHUNT      Current Medications: Current Meds  Medication Sig  . aspirin EC 81 MG tablet Take 1 tablet (81 mg total) by mouth daily.  Marland Kitchen atorvastatin (LIPITOR) 20 MG tablet TAKE 1 TABLET (20 MG TOTAL) BY MOUTH DAILY.  . caffeine 200 MG TABS tablet 1 tab 1-2 times per day  . fish oil-omega-3 fatty acids 1000 MG capsule Take 1 g by mouth daily.  . metoprolol succinate (TOPROL XL) 50 MG 24 hr tablet Take 1 tablet (50 mg total) by mouth daily. Take with or immediately following a meal.  . Multiple Vitamin (MULTIVITAMIN WITH MINERALS) TABS Take 1 tablet by mouth daily.  . nitroGLYCERIN (NITROSTAT) 0.4 MG SL tablet Place 1 tablet (0.4 mg total) under the tongue every 5 (five) minutes as needed for chest pain (or tightness).  . tamsulosin (FLOMAX) 0.4 MG CAPS capsule TAKE 2 CAPSULES BY MOUTH DAILY AFTER BREAKFAST  . varenicline (CHANTIX STARTING MONTH PAK) 0.5 MG X 11 & 1 MG X 42 tablet Take one 0.5mg  tablet by mouth once daily for 3 days, then increase to one 0.5mg  tablet twice daily for 3 days, then increase to one 1mg  tablet twice daily.  . varenicline (CHANTIX) 1 MG tablet Take 1 tablet (1 mg total) by mouth 2 (two) times daily.     Allergies:   Bee pollen and Bee venom   Social History   Socioeconomic History  . Marital status: Married    Spouse name: None  . Number of children: None  . Years of education: None  . Highest education level: None  Social Needs  . Financial resource strain: None  . Food insecurity - worry: None  . Food insecurity - inability: None    . Transportation needs - medical: None  . Transportation needs - non-medical: None  Occupational History  . None  Tobacco Use  . Smoking status: Current Every Day Smoker    Packs/day: 1.00    Years: 20.00    Pack years: 20.00    Types: Cigarettes  . Smokeless tobacco: Never Used  . Tobacco comment: QUIT SMOKING 2011, has restarted   Substance and Sexual Activity  . Alcohol use: No  . Drug use: No  . Sexual activity: None  Other Topics Concern  . None  Social History Narrative  . None     Family History: The patient's family history includes Arthritis in his father and sister; CAD in his father; Diabetes in his father; Hyperlipidemia in his father.  ROS:   Review of Systems  Constitution: Positive for weakness.  HENT: Negative.  Eyes: Negative.   Cardiovascular: Positive for chest pain and dyspnea on exertion.  Respiratory: Positive for shortness of breath.   Endocrine: Negative.   Hematologic/Lymphatic: Negative.   Skin: Negative.   Musculoskeletal: Negative.   Gastrointestinal: Negative.   Genitourinary: Negative.   Psychiatric/Behavioral: Negative.   Allergic/Immunologic: Negative.    Please see the history of present illness.     All other systems reviewed and are negative.  EKGs/Labs/Other Studies Reviewed:    The following studies were reviewed today: PCP office records reviewed prior to visit  EKG:  EKG is  ordered today.  The ekg ordered today demonstrates STH normal  Recent Labs: 08/19/2017: ALT 17; BUN 16; Creat 1.02; Hemoglobin 15.6; Platelets 268; Potassium 3.9; Sodium 136; TSH 0.97  Recent Lipid Panel    Component Value Date/Time   CHOL 132 08/19/2017 1629   TRIG 108 08/19/2017 1629   HDL 39 (L) 08/19/2017 1629   CHOLHDL 3.4 08/19/2017 1629   VLDL 19 04/08/2016 0832   LDLCALC 66 04/08/2016 0832    Physical Exam:    VS:  BP 124/78 (BP Location: Left Arm, Patient Position: Sitting, Cuff Size: Normal)   Pulse 81   Ht 5\' 7"  (1.702 m)   Wt  174 lb (78.9 kg)   SpO2 98%   BMI 27.25 kg/m     Wt Readings from Last 3 Encounters:  08/26/17 174 lb (78.9 kg)  08/19/17 174 lb (78.9 kg)  04/14/17 180 lb (81.6 kg)     GEN:  Well nourished, well developed in no acute distress HEENT: Normal NECK: No JVD; No carotid bruits LYMPHATICS: No lymphadenopathy CARDIAC: RRR, no murmurs, rubs, gallops RESPIRATORY:  Clear to auscultation without rales, wheezing or rhonchi  ABDOMEN: Soft, non-tender, non-distended MUSCULOSKELETAL:  No edema; No deformity  SKIN: Warm and dry NEUROLOGIC:  Alert and oriented x 3 PSYCHIATRIC:  Normal affect     Signed, Norman HerrlichBrian Munley, MD  08/26/2017 2:05 PM    [facility]

## 2017-08-26 ENCOUNTER — Encounter: Payer: Self-pay | Admitting: Cardiology

## 2017-08-26 ENCOUNTER — Ambulatory Visit: Payer: BLUE CROSS/BLUE SHIELD | Admitting: Cardiology

## 2017-08-26 VITALS — BP 124/78 | HR 81 | Ht 67.0 in | Wt 174.0 lb

## 2017-08-26 DIAGNOSIS — I209 Angina pectoris, unspecified: Secondary | ICD-10-CM

## 2017-08-26 DIAGNOSIS — E785 Hyperlipidemia, unspecified: Secondary | ICD-10-CM

## 2017-08-26 NOTE — Patient Instructions (Signed)
Medication Instructions:  Your physician recommends that you continue on your current medications as directed. Please refer to the Current Medication list given to you today.   Labwork: NONE  Testing/Procedures: Your physician has requested that you have cardiac CT. Cardiac computed tomography (CT) is a painless test that uses an x-ray machine to take clear, detailed pictures of your heart. For further information please visit [email or web address]. Please follow instruction sheet as given.     Follow-Up: Your physician recommends that you schedule a follow-up appointment in: 1 month (after cardiac CT)    Any Other Special Instructions Will Be Listed Below (If Applicable).     If you need a refill on your cardiac medications before your next appointment, please call your pharmacy.

## 2017-09-02 ENCOUNTER — Ambulatory Visit: Payer: BLUE CROSS/BLUE SHIELD | Admitting: Sports Medicine

## 2017-09-02 ENCOUNTER — Other Ambulatory Visit: Payer: Self-pay | Admitting: Sports Medicine

## 2017-09-02 DIAGNOSIS — E785 Hyperlipidemia, unspecified: Secondary | ICD-10-CM

## 2017-09-02 DIAGNOSIS — N139 Obstructive and reflux uropathy, unspecified: Secondary | ICD-10-CM

## 2017-09-22 ENCOUNTER — Ambulatory Visit (HOSPITAL_COMMUNITY)
Admission: RE | Admit: 2017-09-22 | Discharge: 2017-09-22 | Disposition: A | Discharge status: Home / Self Care | Payer: BLUE CROSS/BLUE SHIELD | Source: Ambulatory Visit | Attending: Cardiology | Admitting: Cardiology

## 2017-09-22 DIAGNOSIS — I209 Angina pectoris, unspecified: Secondary | ICD-10-CM

## 2017-09-22 DIAGNOSIS — I25119 Atherosclerotic heart disease of native coronary artery with unspecified angina pectoris: Secondary | ICD-10-CM | POA: Diagnosis not present

## 2017-09-22 DIAGNOSIS — R079 Chest pain, unspecified: Secondary | ICD-10-CM | POA: Diagnosis not present

## 2017-09-22 DIAGNOSIS — J9811 Atelectasis: Secondary | ICD-10-CM | POA: Diagnosis not present

## 2017-09-22 LAB — POCT I-STAT CREATININE: CREATININE: 0.8 mg/dL (ref 0.61–1.24)

## 2017-09-22 MED ORDER — METOPROLOL TARTRATE 5 MG/5ML IV SOLN
INTRAVENOUS | Status: AC
  Administered 2017-09-22: 10 mg
  Filled 2017-09-22: qty 15

## 2017-09-22 MED ORDER — IOPAMIDOL (ISOVUE-370) INJECTION 76%
INTRAVENOUS | Status: AC
  Administered 2017-09-22: 80 mL via INTRAVENOUS
  Filled 2017-09-22: qty 100

## 2017-09-22 MED ORDER — IOPAMIDOL (ISOVUE-370) INJECTION 76%
INTRAVENOUS | Status: AC
  Filled 2017-09-22: qty 100

## 2017-09-22 MED ORDER — NITROGLYCERIN 0.4 MG SL SUBL
SUBLINGUAL_TABLET | SUBLINGUAL | Status: AC
  Administered 2017-09-22: 0.8 mg
  Filled 2017-09-22: qty 2

## 2017-09-22 NOTE — Progress Notes (Signed)
Cardiology Office Note:    Date:  09/23/2017   ID:  Frederick Ward, DOB 08/05/1970, MRN [id number]  PCP:  Monica Becton, MD  Cardiologist:  Norman Herrlich, MD    Referring MD: Monica Becton,*     ASSESSMENT:    1. Coronary artery disease of native artery of native heart with stable angina pectoris (HCC)   2. Hyperlipidemia, unspecified hyperlipidemia type    PLAN:    In order of problems listed above:  1. Stable he has a paucity of symptoms and has anginal discomfort perhaps twice a year with very vigorous outdoor work in hot weather and has a CTA with mild less than 50% coronary stenoses.  He will continue medical treatment I will intensify lipid-lowering therapy and do an advanced lipid profile with his family history of CAD. 2. He was switched to a more potent statin rosuvastatin and follow-up advanced lipid profile in 6 weeks   Next appointment: 3 months   Medication Adjustments/Labs and Tests Ordered: Current medicines are reviewed at length with the patient today.  Concerns regarding medicines are outlined above.  Orders Placed This Encounter  Procedures  . Lipoprotein Analysis by NMR   Meds ordered this encounter  Medications  . rosuvastatin (CRESTOR) 20 MG tablet    Sig: Take 1 tablet (20 mg total) by mouth daily.    Dispense:  90 tablet    Refill:  3    Chief Complaint  Patient presents with  . Follow-up    1 month flup after cardiac CT  . Coronary Artery Disease    History of Present Illness:    Frederick Ward is a 47 y.o. male with a hx of exertional chest pain last seen 08/26/17.  ASSESSMENT:    08/26/17   1. Chest pain, unspecified type   2. Hyperlipidemia, unspecified hyperlipidemia type    PLAN:    1. He has a pattern of stable angina.  We will continue current medical treatment aspirin beta-blocker and high intensity statin.  For risk stratification will be sent for cardiac CTA was fractional flow reserve to assess whether  revascularization is appropriate.  He also has nitroglycerin available to use as needed after the study is performed he will follow-up with me in my office.   2. Stable continue statin.  If CTA confirms CAD and has revascularization I would intensify therapy with a goal LDL less than 50 for residual risk.  Compliance with diet, lifestyle and medications: Yes  I gave him a copy of his CTA report he seems to be at peace with the results.  He is not having recent angina shortness of breath palpitation or syncope.  He does not get activity on a sustained basis and I asked him to develop 20-30 minutes/day Past Medical History:  Diagnosis Date  . Annual physical exam 07/26/2014  . Chest pain 08/25/2017  . History of right peroneus brevis tear post repair 07/26/2014  . Hyperlipidemia 07/26/2014  . Lumbar facet joint syndrome 04/11/2016  . Obstructive uropathy 03/19/2016  . Osteoarthritis of both knees 07/26/2014   Postarthroscopy and partial meniscectomy on the right. Unloader brace on the right. Status post anterior cruciate ligament repair with reconstruction on the left.  Orthovisc approved   . Pain    LEFT SHOULDER- ROTATOR CUFF  TEAR  . Primary osteoarthritis of first carpometacarpal joint of right hand 06/04/2016  . 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 Rotator cuff tear, left 06/18/2012  . S/P ventriculoperitoneal shunt 07/26/2014  .  Smoker 08/19/2017  . Stable angina (HCC) 08/19/2017  . 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 Yellow jacket sting allergy     Past Surgical History:  Procedure Laterality Date  . ANTERIOR CRUCIATE LIGAMENT REPAIR  1997 OR 1998   LEFT KNEE  . LEFT ANKLE TENDON TRANSFER  FEB 2010  . RIGHT ANKLE TENDON TRANSFER  SEPT 30, 2013   SURGERY WAS DONE IN [facility]--PT WEARS  ORTHOTIC BOOT WHEN AMBULATING--HEALING INCISION--SOME WEAKNESS STILL  . RIGHT  WRIST FIXATION AUG  2006    . SHOULDER ARTHROSCOPY WITH ROTATOR CUFF REPAIR AND SUBACROMIAL DECOMPRESSION  06/18/2012   Procedure: SHOULDER ARTHROSCOPY WITH ROTATOR CUFF REPAIR AND SUBACROMIAL DECOMPRESSION;  Surgeon: Javier DockerJeffrey C Beane, MD;  Location: [facility];  Service: Orthopedics;  Laterality: Left;  Left Shoulder arthroscopy subacromial decompression mini open rotator cuff repair   . VENTRICULOPERITONEAL SHUNT      Current Medications: Current Meds  Medication Sig  . atorvastatin (LIPITOR) 20 MG tablet TAKE 1 TABLET BY MOUTH EVERY DAY  . caffeine 200 MG TABS tablet 1 tab 1-2 times per day  . EPINEPHrine 0.3 mg/0.3 mL IJ SOAJ injection Inject 0.3 mLs (0.3 mg total) into the muscle once.  . fish oil-omega-3 fatty acids 1000 MG capsule Take 1 g by mouth daily.  . metoprolol succinate (TOPROL XL) 50 MG 24 hr tablet Take 1 tablet (50 mg total) by mouth daily. Take with or immediately following a meal.  . Multiple Vitamin (MULTIVITAMIN WITH MINERALS) TABS Take 1 tablet by mouth daily.  . nitroGLYCERIN (NITROSTAT) 0.4 MG SL tablet Place 1 tablet (0.4 mg total) under the tongue every 5 (five) minutes as needed for chest pain (or tightness).  . tamsulosin (FLOMAX) 0.4 MG CAPS capsule TAKE 2 CAPSULES BY MOUTH DAILY AFTER BREAKFAST  . varenicline (CHANTIX STARTING MONTH PAK) 0.5 MG X 11 & 1 MG X 42 tablet Take one 0.5mg  tablet by mouth once daily for 3 days, then increase to one 0.5mg  tablet twice daily for 3 days, then increase to one 1mg  tablet twice daily.  . varenicline (CHANTIX) 1 MG tablet Take 1 tablet (1 mg total) by mouth 2 (two) times daily.  . [DISCONTINUED] aspirin EC 81 MG tablet Take 1 tablet (81 mg total) by mouth daily.     Allergies:   Bee pollen and Bee venom   Social History   Socioeconomic History  . Marital status: Married    Spouse name: None  . Number of children: None  . Years of education: None  . Highest education level: None  Social Needs  . Financial resource strain:  None  . Food insecurity - worry: None  . Food insecurity - inability: None  . Transportation needs - medical: None  . Transportation needs - non-medical: None  Occupational History  . None  Tobacco Use  . Smoking status: Current Every Day Smoker    Packs/day: 1.00    Years: 20.00    Pack years: 20.00    Types: Cigarettes  . Smokeless tobacco: Never Used  . Tobacco comment: QUIT SMOKING 2011, has restarted   Substance and Sexual Activity  . Alcohol use: No  . Drug use: No  . Sexual activity: None  Other Topics Concern  . None  Social History Narrative  . None     Family History: The patient's family history includes Arthritis in his father  and sister; CAD in his father; Diabetes in his father; Hyperlipidemia in his father. ROS:   Please see the history of present illness.    All other systems reviewed and are negative.  EKGs/Labs/Other Studies Reviewed:    The following studies were reviewed today:  Cardiac CTA: FINDINGS: Non-cardiac: See separate report from [facility]. No significant findings on limited lung and soft tissue windows.  Calcium Score: 3 vessel coronary artery calcium noted  Coronary Arteries: Right dominant with no anomalies LM: Short segment normal LAD: Less than 30% mixed plaque proximally. Less than 50% calcific plaque in mid vessel D1: Less than 50% calcific plaque in proximal vessel D2: Normal Circumflex: Less than 30% calcific plaque in mid vessel OM1: Normal RCA: Less than 50% calcific plaque in proximal mid and distal vessel PDA: Normal PLA:  50% or less calcific plaque IMPRESSION: 1. Calcium score 274 noted in all 3 major epicardial vessels. This is 75 th percentile for age and sex 2. Advanced CAD for age see description above but appears to be non obstructive Study sent for FFR CT  Recent Labs: 08/19/2017: ALT 17; BUN 16; Hemoglobin 15.6; Platelets 268; Potassium 3.9; Sodium 136; TSH 0.97 09/22/2017: Creatinine, Ser 0.80    Recent Lipid Panel    Component Value Date/Time   CHOL 132 08/19/2017 1629   TRIG 108 08/19/2017 1629   HDL 39 (L) 08/19/2017 1629   CHOLHDL 3.4 08/19/2017 1629   VLDL 19 04/08/2016 0832   LDLCALC 66 04/08/2016 0832    Physical Exam:    VS:  BP 116/80 (BP Location: Right Arm, Patient Position: Sitting, Cuff Size: Normal)   Pulse 78   Ht 5\' 7"  (1.702 m)   Wt 175 lb 1.9 oz (79.4 kg)   SpO2 98%   BMI 27.43 kg/m     Wt Readings from Last 3 Encounters:  09/23/17 175 lb 1.9 oz (79.4 kg)  08/26/17 174 lb (78.9 kg)  08/19/17 174 lb (78.9 kg)     GEN:  Well nourished, well developed in no acute distress HEENT: Normal NECK: No JVD; No carotid bruits LYMPHATICS: No lymphadenopathy CARDIAC: RRR, no murmurs, rubs, gallops RESPIRATORY:  Clear to auscultation without rales, wheezing or rhonchi  ABDOMEN: Soft, non-tender, non-distended MUSCULOSKELETAL:  No edema; No deformity  SKIN: Warm and dry NEUROLOGIC:  Alert and oriented x 3 PSYCHIATRIC:  Normal affect    Signed, Norman Herrlich, MD  09/23/2017 12:45 PM    [facility]

## 2017-09-23 ENCOUNTER — Ambulatory Visit (INDEPENDENT_AMBULATORY_CARE_PROVIDER_SITE_OTHER): Payer: BLUE CROSS/BLUE SHIELD | Admitting: Cardiology

## 2017-09-23 ENCOUNTER — Encounter: Payer: Self-pay | Admitting: Cardiology

## 2017-09-23 VITALS — BP 116/80 | HR 78 | Ht 67.0 in | Wt 175.1 lb

## 2017-09-23 DIAGNOSIS — I25118 Atherosclerotic heart disease of native coronary artery with other forms of angina pectoris: Secondary | ICD-10-CM | POA: Diagnosis not present

## 2017-09-23 DIAGNOSIS — E785 Hyperlipidemia, unspecified: Secondary | ICD-10-CM

## 2017-09-23 MED ORDER — ROSUVASTATIN CALCIUM 20 MG PO TABS: 20.0000 mg | ORAL_TABLET | Freq: Every day | ORAL | 3 refills | Status: DC

## 2017-09-23 NOTE — Patient Instructions (Addendum)
Medication Instructions:  Your physician has recommended you make the following change in your medication:  STOP atorvastatin START rosuvastatin 20 mg daily  Labwork: Your physician recommends that you return for lab work in: 1 month. Lipoprotein profile.  Testing/Procedures: None  Follow-Up: Your physician recommends that you schedule a follow-up appointment in: 3 months.  Any Other Special Instructions Will Be Listed Below (If Applicable).     If you need a refill on your cardiac medications before your next appointment, please call your pharmacy.    You need  20-30 minutes of activity a day   Coronary Artery Disease, Male Coronary artery disease (CAD) is a condition in which the arteries that lead to the heart (coronary arteries) become narrow or blocked. The narrowing or blockage can lead to decreased blood flow to the heart. Prolonged reduced blood flow can cause a heart attack (myocardial infarction or MI). This condition may also be called coronary heart disease. Because CAD is the leading cause of death in men, it is important to understand what causes this condition and how it is treated. What are the causes? CAD is most often caused by atherosclerosis. This is the buildup of fat and cholesterol (plaque) on the inside of the arteries. Over time, the plaque may narrow or block the artery, reducing blood flow to the heart. Plaque can also become weak and break off within a coronary artery and cause a sudden blockage. Other less common causes of CAD include:  An embolism or blood clot in a coronary artery.  A tearing of the artery (spontaneous coronary artery dissection).  An aneurysm.  Inflammation (vasculitis) in the artery wall.  What increases the risk? The following factors may make you more likely to develop this condition:  Age. Men over age 38 are at a greater risk of CAD.  Family history of CAD.  Gender. Men often develop CAD earlier in life than  women.  High blood pressure (hypertension).  Diabetes.  High cholesterol levels.  Tobacco use.  Excessive alcohol use.  Lack of exercise.  A diet high in saturated and trans fats, such as fried food and processed meat.  Other possible risk factors include:  High stress levels.  Depression.  Obesity.  Sleep apnea.  What are the signs or symptoms? Many people do not have any symptoms during the early stages of CAD. As the condition progresses, symptoms may include:  Chest pain (angina). The pain can: ? Feel like a crushing or squeezing, or a tightness, pressure, fullness, or heaviness in the chest. ? Last more than a few minutes or can stop and recur. The pain tends to get worse with exercise or stress and to fade with rest.  Pain in the arms, neck, jaw, or back.  Unexplained heartburn or indigestion.  Shortness of breath.  Nausea or vomiting.  Sudden light-headedness.  Sudden cold sweats.  Fluttering or fast heartbeat (palpitations).  How is this diagnosed? This condition is diagnosed based on:  Your family and medical history.  A physical exam.  Tests, including: ? A test to check the electrical signals in your heart (electrocardiogram). ? Exercise stress test. This looks for signs of blockage when the heart is stressed with exercise, such as running on a treadmill. ? Pharmacologic stress test. This test looks for signs of blockage when the heart is being stressed with a medicine. ? Blood tests. ? Coronary angiogram. This is a procedure to look at the coronary arteries to see if there is any blockage.  During this test, a dye is injected into your arteries so they appear on an X-ray. ? A test that uses sound waves to take a picture of your heart (echocardiogram). ? Chest X-ray.  How is this treated? This condition may be treated by:  Healthy lifestyle changes to reduce risk factors.  Medicines such as: ? Antiplatelet medicines and blood-thinning  medicines, such as aspirin. These help to prevent blood clots. ? Nitroglycerin. ? Blood pressure medicines. ? Cholesterol-lowering medicine.  Coronary angioplasty and stenting. During this procedure, a thin, flexible tube is inserted through a blood vessel and into a blocked artery. A balloon or similar device on the end of the tube is inflated to open up the artery. In some cases, a small, mesh tube (stent) is inserted into the artery to keep it open.  Coronary artery bypass surgery. During this surgery, veins or arteries from other parts of the body are used to create a bypass around the blockage and allow blood to reach your heart.  Follow these instructions at home: Medicines  Take over-the-counter and prescription medicines only as told by your health care provider.  Do not take the following medicines unless your health care provider approves: ? NSAIDs, such as ibuprofen, naproxen, or celecoxib. ? Vitamin supplements that contain vitamin A, vitamin E, or both. Lifestyle  Follow an exercise program approved by your health care provider. Aim for 150 minutes of moderate exercise or 75 minutes of vigorous exercise each week.  Maintain a healthy weight or lose weight as approved by your health care provider.  Rest when you are tired.  Learn to manage stress or try to limit your stress. Ask your health care provider for suggestions if you need help.  Get screened for depression and seek treatment, if needed.  Do not use any products that contain nicotine or tobacco, such as cigarettes and e-cigarettes. If you need help quitting, ask your health care provider.  Do not use illegal drugs. Eating and drinking  Follow a heart-healthy diet. A dietitian can help educate you about healthy food options and changes. In general, eat plenty of fruits and vegetables, lean meats, and whole grains.  Avoid foods high in: ? Sugar. ? Salt (sodium). ? Saturated fat, such as processed or fatty  meat. ? Trans fat, such as fried foods.  Use healthy cooking methods such as roasting, grilling, broiling, baking, poaching, steaming, or stir-frying.  If you drink alcohol, and your health care provider approves, limit your alcohol intake to no more than 2 drinks per day. One drink equals 12 ounces of beer, 5 ounces of wine, or 1 ounces of hard liquor. General instructions  Manage any other health conditions, such as hypertension and diabetes. These conditions affect your heart.  Your health care provider may ask you to monitor your blood pressure. Ideally, your blood pressure should be below 130/80.  Keep all follow-up visits as told by your health care provider. This is important. Get help right away if:  You have pain in your chest, neck, arm, jaw, stomach, or back that: ? Lasts more than a few minutes. ? Is recurring. ? Is not relieved by taking medicine under your tongue (sublingualnitroglycerin).  You have too much (profuse) sweating without cause.  You have unexplained: ? Heartburn or indigestion. ? Shortness of breath or difficulty breathing. ? Fluttering or fast heartbeat (palpitations). ? Nausea or vomiting. ? Fatigue. ? Feelings of nervousness or anxiety. ? Weakness. ? Diarrhea.  You have sudden light-headedness or dizziness.  You faint.  You feel like hurting yourself or think about taking your own life. These symptoms may represent a serious problem that is an emergency. Do not wait to see if the symptoms will go away. Get medical help right away. Call your local emergency services (911 in the [place] Do not drive yourself to the hospital. Summary  Coronary artery disease (CAD) is a process in which the arteries that lead to the heart (coronary arteries) become narrow or blocked. The narrowing or blockage can lead to a heart attack.  Many people do not have any symptoms during the early stages of CAD. This is called "silent CAD."  CAD can be treated with  lifestyle changes, medicines, surgery, or a combination of these treatments. This information is not intended to replace advice given to you by your health care provider. Make sure you discuss any questions you have with your health care provider. Document Released: 01/26/2014 Document Revised: 06/21/2016 Document Reviewed: 06/21/2016 Elsevier Interactive Patient Education  [employer].

## 2017-10-07 DIAGNOSIS — E785 Hyperlipidemia, unspecified: Secondary | ICD-10-CM | POA: Diagnosis not present

## 2017-10-08 ENCOUNTER — Other Ambulatory Visit: Payer: Self-pay | Admitting: Cardiology

## 2017-10-08 ENCOUNTER — Telehealth: Payer: Self-pay

## 2017-10-08 DIAGNOSIS — E785 Hyperlipidemia, unspecified: Secondary | ICD-10-CM

## 2017-10-08 DIAGNOSIS — I25118 Atherosclerotic heart disease of native coronary artery with other forms of angina pectoris: Secondary | ICD-10-CM

## 2017-10-08 LAB — LIPOPROTEIN ANALYSIS BY NMR
HDL PARTICLE NUMBER: 33 umol/L (ref >=30.5)
LDL PARTICLE NUMBER: 713 nmol/L (ref <1000)
LDL Size: 19.9 nm — ABNORMAL LOW (ref >20.5)
LP-IR Score: 42 (ref <=45)
Small LDL Particle Number: 513 nmol/L (ref <=527)

## 2017-10-08 MED ORDER — EZETIMIBE 10 MG PO TABS
10.0000 mg | ORAL_TABLET | Freq: Every day | ORAL | 11 refills | Status: DC
Start: 2017-10-08 — End: 2018-01-19

## 2017-10-08 MED ORDER — EZETIMIBE 10 MG PO TABS: 10.0000 mg | ORAL_TABLET | Freq: Every day | ORAL | 3 refills | Status: DC

## 2017-10-08 NOTE — Telephone Encounter (Signed)
-----   Message from Baldo DaubBrian J Munley, MD sent at 10/08/2017 11:58 AM EDT ----- His LDL is small dense and I would like to add zetia 10 mg day to rosuvastatin, recheck a lipid panel after 1 month

## 2017-10-08 NOTE — Progress Notes (Unsigned)
zet

## 2017-10-08 NOTE — Telephone Encounter (Signed)
Patient informed of results. Advised patient to start Zetia 10 mg daily in addition to rosuvastatin. Patient advised he will need a lipid panel recheck in 1 month. Patient advised to be fasting when he comes. Patient verbalized understanding, no further questions.

## 2017-10-10 ENCOUNTER — Ambulatory Visit: Payer: BLUE CROSS/BLUE SHIELD | Admitting: Sports Medicine

## 2017-10-10 ENCOUNTER — Encounter: Payer: Self-pay | Admitting: Sports Medicine

## 2017-10-10 DIAGNOSIS — I208 Other forms of angina pectoris: Secondary | ICD-10-CM | POA: Diagnosis not present

## 2017-10-10 DIAGNOSIS — I2089 Other forms of angina pectoris: Secondary | ICD-10-CM

## 2017-10-10 NOTE — Assessment & Plan Note (Addendum)
preciate assistance from cardiology. Continue Toprol-XL 50, switched to Crestor as well as Zetia added for additional lipid lowering considering coronary artery calcium score on CT. He will keep nitroglycerin with him. He is going to look into having a dietitian consult and will let me know if he would like me to do the referral. Return to see me in 6 months but keep close follow-up with cardiology, he will have another advanced lipid profile in 1 month. No further anginal symptoms. No stress test or catheterization planned.  Simply aggressive medical management.

## 2017-10-10 NOTE — Progress Notes (Signed)
Subjective:    CC: Follow-up  HPI: Loistine Chance returns, he saw the cardiologist, CT coronary calcium score was high, we have intensified his statin therapy, and he continues with a beta-blocker, has not had any further anginal episodes.  No stress test or catheterization planned.  Simply aggressive medical management.  I reviewed the past medical history, family history, social history, surgical history, and allergies today and no changes were needed.  Please see the problem list section below in epic for further details.  Past Medical History: Past Medical History:  Diagnosis Date  . Annual physical exam 07/26/2014  . Chest pain 08/25/2017  . History of right peroneus brevis tear post repair 07/26/2014  . Hyperlipidemia 07/26/2014  . Lumbar facet joint syndrome 04/11/2016  . Obstructive uropathy 03/19/2016  . Osteoarthritis of both knees 07/26/2014   Postarthroscopy and partial meniscectomy on the right. Unloader brace on the right. Status post anterior cruciate ligament repair with reconstruction on the left.  Orthovisc approved   . Pain    LEFT SHOULDER- ROTATOR CUFF  TEAR  . Primary osteoarthritis of first carpometacarpal joint of right hand 06/04/2016  . 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 Rotator cuff tear, left 06/18/2012  . S/P ventriculoperitoneal shunt 07/26/2014  . Smoker 08/19/2017  . Stable angina (HCC) 08/19/2017  . 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 Yellow jacket sting allergy    Past Surgical History: Past Surgical History:  Procedure Laterality Date  . ANTERIOR CRUCIATE LIGAMENT REPAIR  1997 OR 1998   LEFT KNEE  . LEFT ANKLE TENDON TRANSFER  FEB 2010  . RIGHT ANKLE TENDON TRANSFER  SEPT 30, 2013   SURGERY WAS DONE IN [facility]--PT WEARS  ORTHOTIC BOOT WHEN AMBULATING--HEALING INCISION--SOME WEAKNESS STILL  . RIGHT WRIST FIXATION AUG  2006    .  SHOULDER ARTHROSCOPY WITH ROTATOR CUFF REPAIR AND SUBACROMIAL DECOMPRESSION  06/18/2012   Procedure: SHOULDER ARTHROSCOPY WITH ROTATOR CUFF REPAIR AND SUBACROMIAL DECOMPRESSION;  Surgeon: Javier Docker, MD;  Location: [facility];  Service: Orthopedics;  Laterality: Left;  Left Shoulder arthroscopy subacromial decompression mini open rotator cuff repair   . VENTRICULOPERITONEAL SHUNT     Social History: Social History   Socioeconomic History  . Marital status: Married    Spouse name: Not on file  . Number of children: Not on file  . Years of education: Not on file  . Highest education level: Not on file  Occupational History  . Not on file  Social Needs  . Financial resource strain: Not on file  . Food insecurity:    Worry: Not on file    Inability: Not on file  . Transportation needs:    Medical: Not on file    Non-medical: Not on file  Tobacco Use  . Smoking status: Current Every Day Smoker    Packs/day: 1.00    Years: 20.00    Pack years: 20.00    Types: Cigarettes  . Smokeless tobacco: Never Used  . Tobacco comment: QUIT SMOKING 2011, has restarted   Substance and Sexual Activity  . Alcohol use: No  . Drug use: No  . Sexual activity: Not on file  Lifestyle  . Physical activity:    Days per week: Not on file    Minutes per session: Not on file  . Stress: Not on file  Relationships  . Social connections:  Talks on phone: Not on file    Gets together: Not on file    Attends religious service: Not on file    Active member of club or organization: Not on file    Attends meetings of clubs or organizations: Not on file    Relationship status: Not on file  Other Topics Concern  . Not on file  Social History Narrative  . Not on file   Family History: Family History  Problem Relation Age of Onset  . Diabetes Father   . CAD Father   . Hyperlipidemia Father   . Arthritis Father   . Arthritis Sister    Allergies: Allergies  Allergen Reactions  . Bee Pollen Other  (See Comments)    syncope  . Bee Venom Other (See Comments)    Numbness *fell x2 and muscles wouldn't work well, 21 Mar 2005   Medications: See med rec.  Review of Systems: No fevers, chills, night sweats, weight loss, chest pain, or shortness of breath.   Objective:    General: Well Developed, well nourished, and in no acute distress.  Neuro: Alert and oriented x3, extra-ocular muscles intact, sensation grossly intact.  HEENT: Normocephalic, atraumatic, pupils equal round reactive to light, neck supple, no masses, no lymphadenopathy, thyroid nonpalpable.  Skin: Warm and dry, no rashes. Cardiac: Regular rate and rhythm, no murmurs rubs or gallops, no lower extremity edema.  Respiratory: Clear to auscultation bilaterally. Not using accessory muscles, speaking in full sentences.  Impression and Recommendations:    Stable angina (HCC) preciate assistance from cardiology. Continue Toprol-XL 50, switched to Crestor as well as Zetia added for additional lipid lowering considering coronary artery calcium score on CT. He will keep nitroglycerin with him. He is going to look into having a dietitian consult and will let me know if he would like me to do the referral. Return to see me in 6 months but keep close follow-up with cardiology, he will have another advanced lipid profile in 1 month. No further anginal symptoms. No stress test or catheterization planned.  Simply aggressive medical management. ___________________________________________ Ihor Austinhomas J. Benjamin Stainhekkekandam, M.D., ABFM., CAQSM. Primary Care and Sports Medicine [facility]  Adjunct Instructor of Family Medicine  [employer]

## 2017-10-31 DIAGNOSIS — Z9889 Other specified postprocedural states: Secondary | ICD-10-CM | POA: Diagnosis not present

## 2017-10-31 DIAGNOSIS — M19041 Primary osteoarthritis, right hand: Secondary | ICD-10-CM | POA: Diagnosis not present

## 2017-11-10 ENCOUNTER — Telehealth: Payer: Self-pay

## 2017-11-10 NOTE — Telephone Encounter (Signed)
Reminded patient to have fasting lab work at this office. Patient states he has it on the calendar for Friday 11/14/17. No further questions.

## 2017-11-14 DIAGNOSIS — E785 Hyperlipidemia, unspecified: Secondary | ICD-10-CM | POA: Diagnosis not present

## 2017-11-15 LAB — LIPID PANEL
CHOL/HDL RATIO: 2.7 ratio (ref 0.0–5.0)
Cholesterol, Total: 95 mg/dL — ABNORMAL LOW (ref 100–199)
HDL: 35 mg/dL — AB (ref >39)
LDL CALC: 42 mg/dL (ref 0–99)
TRIGLYCERIDES: 92 mg/dL (ref 0–149)
VLDL CHOLESTEROL CAL: 18 mg/dL (ref 5–40)

## 2017-11-17 ENCOUNTER — Other Ambulatory Visit: Payer: Self-pay | Admitting: Sports Medicine

## 2017-11-17 DIAGNOSIS — N139 Obstructive and reflux uropathy, unspecified: Secondary | ICD-10-CM

## 2017-12-10 ENCOUNTER — Other Ambulatory Visit: Payer: Self-pay | Admitting: Sports Medicine

## 2017-12-10 DIAGNOSIS — I208 Other forms of angina pectoris: Secondary | ICD-10-CM

## 2017-12-23 NOTE — Progress Notes (Signed)
Cardiology Office Note:    Date:  12/24/2017   ID:  Frederick Ward, DOB 01-04-1971, MRN [id number]  PCP:  Monica Becton, MD  Cardiologist:  Norman Herrlich, MD    Referring MD: Monica Becton,*    ASSESSMENT:    1. Hyperlipidemia LDL goal <70   2. Mild CAD    PLAN:    In order of problems listed above:  1. Improved his advanced lipid profile showed a predominance of small dense LDL and he is now on combined lipid-lowering therapy with an LDL less than 50 at target continue his statin and Zetia. 2. Stable no anginal discomfort continue medical treatment   Next appointment: 52yr ask his PCP to follow his lipids in the interim   Medication Adjustments/Labs and Tests Ordered: Current medicines are reviewed at length with the patient today.  Concerns regarding medicines are outlined above.  No orders of the defined types were placed in this encounter.  No orders of the defined types were placed in this encounter.   Chief Complaint  Patient presents with  . Follow-up  . Coronary Artery Disease  . Hyperlipidemia    History of Present Illness:    Frederick Ward is a 47 y.o. male with a hx of exertional chest pain, mild CAD  and hyperlipidemia last seen 09/23/17.  ASSESSMENT:    1. Coronary artery disease of native artery of native heart with stable angina pectoris (HCC)   2. Hyperlipidemia, unspecified hyperlipidemia type    PLAN:    1.    He has a paucity of symptoms and has anginal discomfort perhaps twice a year with very vigorous outdoor work in hot weather and has a CTA with mild less than 50% coronary stenoses.  He will continue medical treatment I will intensify lipid-lowering therapy and do an advanced lipid profile with his family history of CAD.  2.   He was switched to a more potent statin rosuvastatin and follow-up advanced lipid profile in 6 weeks   Compliance with diet, lifestyle and medications: Yes Past Medical History:  Diagnosis Date    . Annual physical exam 07/26/2014  . Chest pain 08/25/2017  . History of right peroneus brevis tear post repair 07/26/2014  . Hyperlipidemia 07/26/2014  . Lumbar facet joint syndrome 04/11/2016  . Obstructive uropathy 03/19/2016  . Osteoarthritis of both knees 07/26/2014   Postarthroscopy and partial meniscectomy on the right. Unloader brace on the right. Status post anterior cruciate ligament repair with reconstruction on the left.  Orthovisc approved   . Pain    LEFT SHOULDER- ROTATOR CUFF  TEAR  . Primary osteoarthritis of first carpometacarpal joint of right hand 06/04/2016  . 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 Rotator cuff tear, left 06/18/2012  . S/P ventriculoperitoneal shunt 07/26/2014  . Smoker 08/19/2017  . Stable angina (HCC) 08/19/2017  . 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 Yellow jacket sting allergy     Past Surgical History:  Procedure Laterality Date  . ANTERIOR CRUCIATE LIGAMENT REPAIR  1997 OR 1998   LEFT KNEE  . LEFT ANKLE TENDON TRANSFER  FEB 2010  . RIGHT ANKLE TENDON TRANSFER  SEPT 30, 2013   SURGERY [facility] DONE IN [facility]--PT WEARS  ORTHOTIC BOOT WHEN AMBULATING--HEALING INCISION--SOME WEAKNESS STILL  . RIGHT WRIST FIXATION AUG  2006    . SHOULDER ARTHROSCOPY WITH ROTATOR CUFF REPAIR AND  SUBACROMIAL DECOMPRESSION  06/18/2012   Procedure: SHOULDER ARTHROSCOPY WITH ROTATOR CUFF REPAIR AND SUBACROMIAL DECOMPRESSION;  Surgeon: Javier Docker, MD;  Location: [facility];  Service: Orthopedics;  Laterality: Left;  Left Shoulder arthroscopy subacromial decompression mini open rotator cuff repair   . VENTRICULOPERITONEAL SHUNT      Current Medications: Current Meds  Medication Sig  . aspirin 81 MG EC tablet Take 81 mg by mouth daily.  . caffeine 200 MG TABS tablet 1 tab 1-2 times per day (Patient taking differently: 200 mg daily as needed. 1 tab 1-2 times  per day)  . carisoprodol (SOMA) 350 MG tablet TAKE 1 TABLET BY MOUTH 3 TIMES A DAY AS NEEDED FOR MUSCLE SPASMS  . EPINEPHrine 0.3 mg/0.3 mL IJ SOAJ injection Inject 0.3 mg into the muscle once.  . ezetimibe (ZETIA) 10 MG tablet Take 1 tablet (10 mg total) by mouth daily.  . fish oil-omega-3 fatty acids 1000 MG capsule Take 1 g by mouth daily.  . meloxicam (MOBIC) 15 MG tablet TAKE 1 TABLET BY MOUTH EVERY MORNING WITH BREAKFAST FOR 2 WEEKS AND THEN DAILY AS NEEDED FOR PAIN  . metoprolol succinate (TOPROL-XL) 50 MG 24 hr tablet TAKE 1 TABLET BY MOUTH EVERY DAY WITH A MEAL.  . Multiple Vitamin (MULTIVITAMIN WITH MINERALS) TABS Take 1 tablet by mouth daily.  . nitroGLYCERIN (NITROSTAT) 0.4 MG SL tablet Place 1 tablet (0.4 mg total) under the tongue every 5 (five) minutes as needed for chest pain (or tightness).  Marland Kitchen oxyCODONE (ROXICODONE) 5 MG immediate release tablet Take by mouth. Take one tablet to two tablets (5-10 mg dose) by mouth every 8 (eight) hours as needed for Pain.  . rosuvastatin (CRESTOR) 20 MG tablet Take 20 mg by mouth daily.  . tamsulosin (FLOMAX) 0.4 MG CAPS capsule TAKE 2 CAPSULES BY MOUTH DAILY AFTER BREAKFAST  . varenicline (CHANTIX) 1 MG tablet Take 1 tablet (1 mg total) by mouth 2 (two) times daily.     Allergies:   Bee pollen and Bee venom   Social History   Socioeconomic History  . Marital status: Married    Spouse name: Not on file  . Number of children: Not on file  . Years of education: Not on file  . Highest education level: Not on file  Occupational History  . Not on file  Social Needs  . Financial resource strain: Not on file  . Food insecurity:    Worry: Not on file    Inability: Not on file  . Transportation needs:    Medical: Not on file    Non-medical: Not on file  Tobacco Use  . Smoking status: Current Every Day Smoker    Packs/day: 1.00    Years: 20.00    Pack years: 20.00    Types: Cigarettes  . Smokeless tobacco: Never Used  . Tobacco  comment: QUIT SMOKING 2011, has restarted   Substance and Sexual Activity  . Alcohol use: No  . Drug use: No  . Sexual activity: Not on file  Lifestyle  . Physical activity:    Days per week: Not on file    Minutes per session: Not on file  . Stress: Not on file  Relationships  . Social connections:    Talks on phone: Not on file    Gets together: Not on file    Attends religious service: Not on file    Active member of club or organization: Not on file    Attends meetings of clubs  or organizations: Not on file    Relationship status: Not on file  Other Topics Concern  . Not on file  Social History Narrative  . Not on file     Family History: The patient's family history includes Arthritis in his father and sister; CAD in his father; Diabetes in his father; Hyperlipidemia in his father. ROS:   Please see the history of present illness.    All other systems reviewed and are negative.  EKGs/Labs/Other Studies Reviewed:    The following studies were reviewed today:    Recent Labs: 08/19/2017: ALT 17; BUN 16; Hemoglobin 15.6; Platelets 268; Potassium 3.9; Sodium 136; TSH 0.97 09/22/2017: Creatinine, Ser 0.80  Recent Lipid Panel    Component Value Date/Time   CHOL 95 (L) 11/14/2017 1030   TRIG 92 11/14/2017 1030   HDL 35 (L) 11/14/2017 1030   CHOLHDL 2.7 11/14/2017 1030   CHOLHDL 3.4 08/19/2017 1629   VLDL 19 04/08/2016 0832   LDLCALC 42 11/14/2017 1030   LDLCALC 74 08/19/2017 1629    Physical Exam:    VS:  BP 110/70 (BP Location: Right Arm, Patient Position: Sitting, Cuff Size: Normal)   Pulse 68   Ht 5\' 7"  (1.702 m)   Wt 172 lb (78 kg)   SpO2 98%   BMI 26.94 kg/m     Wt Readings from Last 3 Encounters:  12/24/17 172 lb (78 kg)  10/10/17 175 lb (79.4 kg)  09/23/17 175 lb 1.9 oz (79.4 kg)     GEN:  Well nourished, well developed in no acute distress HEENT: Normal NECK: No JVD; No carotid bruits LYMPHATICS: No lymphadenopathy CARDIAC: RRR, no murmurs,  rubs, gallops RESPIRATORY:  Clear to auscultation without rales, wheezing or rhonchi  ABDOMEN: Soft, non-tender, non-distended MUSCULOSKELETAL:  No edema; No deformity  SKIN: Warm and dry NEUROLOGIC:  Alert and oriented x 3 PSYCHIATRIC:  Normal affect    Signed, Norman HerrlichBrian Munley, MD  12/24/2017 11:05 AM    [facility]

## 2017-12-24 ENCOUNTER — Encounter: Payer: Self-pay | Admitting: Cardiology

## 2017-12-24 ENCOUNTER — Ambulatory Visit: Payer: BLUE CROSS/BLUE SHIELD | Admitting: Cardiology

## 2017-12-24 VITALS — BP 110/70 | HR 68 | Ht 67.0 in | Wt 172.0 lb

## 2017-12-24 DIAGNOSIS — E785 Hyperlipidemia, unspecified: Secondary | ICD-10-CM | POA: Diagnosis not present

## 2017-12-24 DIAGNOSIS — I2510 Atherosclerotic heart disease of native coronary artery without angina pectoris: Secondary | ICD-10-CM | POA: Diagnosis not present

## 2017-12-24 HISTORY — DX: Atherosclerotic heart disease of native coronary artery without angina pectoris: I25.10

## 2017-12-24 NOTE — Patient Instructions (Signed)

## 2017-12-26 ENCOUNTER — Ambulatory Visit (INDEPENDENT_AMBULATORY_CARE_PROVIDER_SITE_OTHER): Payer: BLUE CROSS/BLUE SHIELD | Admitting: Sports Medicine

## 2017-12-26 ENCOUNTER — Encounter: Payer: Self-pay | Admitting: Sports Medicine

## 2017-12-26 DIAGNOSIS — M170 Bilateral primary osteoarthritis of knee: Secondary | ICD-10-CM | POA: Diagnosis not present

## 2017-12-26 MED ORDER — DICLOFENAC SODIUM 2 % TD SOLN: 2.0000 | Freq: Two times a day (BID) | TRANSDERMAL | 11 refills | Status: DC

## 2017-12-26 NOTE — Progress Notes (Signed)
Subjective:    CC: Right knee pain  HPI: Frederick Ward is a pleasant 47 year old male, he has known right knee osteoarthritis, we did a series of Orthovisc about 3 to 4 years ago, he did well.  Now having a slight recurrence of knee pain, pain is moderate, persistent, localized anteriorly in the medial joint line without radiation, no mechanical symptoms, mild gelling.  Currently using meloxicam.  I reviewed the past medical history, family history, social history, surgical history, and allergies today and no changes were needed.  Please see the problem list section below in epic for further details.  Past Medical History: Past Medical History:  Diagnosis Date  . Annual physical exam 07/26/2014  . Chest pain 08/25/2017  . History of right peroneus brevis tear post repair 07/26/2014  . Hyperlipidemia 07/26/2014  . Lumbar facet joint syndrome 04/11/2016  . Obstructive uropathy 03/19/2016  . Osteoarthritis of both knees 07/26/2014   Postarthroscopy and partial meniscectomy on the right. Unloader brace on the right. Status post anterior cruciate ligament repair with reconstruction on the left.  Orthovisc approved   . Pain    LEFT SHOULDER- ROTATOR CUFF  TEAR  . Primary osteoarthritis of first carpometacarpal joint of right hand 06/04/2016  . 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 Rotator cuff tear, left 06/18/2012  . S/P ventriculoperitoneal shunt 07/26/2014  . Smoker 08/19/2017  . Stable angina (HCC) 08/19/2017  . 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 Yellow jacket sting allergy    Past Surgical History: Past Surgical History:  Procedure Laterality Date  . ANTERIOR CRUCIATE LIGAMENT REPAIR  1997 OR 1998   LEFT KNEE  . LEFT ANKLE TENDON TRANSFER  FEB 2010  . RIGHT ANKLE TENDON TRANSFER  SEPT 30, 2013   SURGERY WAS DONE IN [facility]--PT WEARS  ORTHOTIC BOOT WHEN AMBULATING--HEALING  INCISION--SOME WEAKNESS STILL  . RIGHT WRIST FIXATION AUG  2006    . SHOULDER ARTHROSCOPY WITH ROTATOR CUFF REPAIR AND SUBACROMIAL DECOMPRESSION  06/18/2012   Procedure: SHOULDER ARTHROSCOPY WITH ROTATOR CUFF REPAIR AND SUBACROMIAL DECOMPRESSION;  Surgeon: Javier Docker, MD;  Location: [facility];  Service: Orthopedics;  Laterality: Left;  Left Shoulder arthroscopy subacromial decompression mini open rotator cuff repair   . VENTRICULOPERITONEAL SHUNT     Social History: Social History   Socioeconomic History  . Marital status: Married    Spouse name: Not on file  . Number of children: Not on file  . Years of education: Not on file  . Highest education level: Not on file  Occupational History  . Not on file  Social Needs  . Financial resource strain: Not on file  . Food insecurity:    Worry: Not on file    Inability: Not on file  . Transportation needs:    Medical: Not on file    Non-medical: Not on file  Tobacco Use  . Smoking status: Current Every Day Smoker    Packs/day: 1.00    Years: 20.00    Pack years: 20.00    Types: Cigarettes  . Smokeless tobacco: Never Used  . Tobacco comment: QUIT SMOKING 2011, has restarted   Substance and Sexual Activity  . Alcohol use: No  . Drug use: No  . Sexual activity: Not on file  Lifestyle  . Physical activity:    Days per week: Not on file    Minutes per session: Not  on file  . Stress: Not on file  Relationships  . Social connections:    Talks on phone: Not on file    Gets together: Not on file    Attends religious service: Not on file    Active member of club or organization: Not on file    Attends meetings of clubs or organizations: Not on file    Relationship status: Not on file  Other Topics Concern  . Not on file  Social History Narrative  . Not on file   Family History: Family History  Problem Relation Age of Onset  . Diabetes Father   . CAD Father   . Hyperlipidemia Father   . Arthritis Father   . Arthritis  Sister    Allergies: Allergies  Allergen Reactions  . Bee Pollen Other (See Comments)    syncope  . Bee Venom Other (See Comments)    Numbness *fell x2 and muscles wouldn't work well, 21 Mar 2005   Medications: See med rec.  Review of Systems: No fevers, chills, night sweats, weight loss, chest pain, or shortness of breath.   Objective:    General: Well Developed, well nourished, and in no acute distress.  Neuro: Alert and oriented x3, extra-ocular muscles intact, sensation grossly intact.  HEENT: Normocephalic, atraumatic, pupils equal round reactive to light, neck supple, no masses, no lymphadenopathy, thyroid nonpalpable.  Skin: Warm and dry, no rashes. Cardiac: Regular rate and rhythm, no murmurs rubs or gallops, no lower extremity edema.  Respiratory: Clear to auscultation bilaterally. Not using accessory muscles, speaking in full sentences. Right knee: Normal to inspection with no erythema or effusion or obvious bony abnormalities. Only minimal pain at the medial joint line. ROM normal in flexion and extension and lower leg rotation. Ligaments with solid consistent endpoints including ACL, PCL, LCL, MCL. Negative Mcmurray's and provocative meniscal tests. Non painful patellar compression. Patellar and quadriceps tendons unremarkable. Hamstring and quadriceps strength is normal.  Impression and Recommendations:    Osteoarthritis of both knees We did Orthovisc approximately 3 years ago, he had a good response, at this point not hurting enough to consider Visco supplementation just yet but he would like me to go ahead and get him approved, we are going to try topical Pennsaid (diclofenac 2% topical). ___________________________________________ Ihor Austinhomas J. Benjamin Stainhekkekandam, M.D., ABFM., CAQSM. Primary Care and Sports Medicine [facility]  Adjunct Instructor of Family Medicine  [employer]

## 2017-12-26 NOTE — Assessment & Plan Note (Signed)
We did Orthovisc approximately 3 years ago, he had a good response, at this point not hurting enough to consider Visco supplementation just yet but he would like me to go ahead and get him approved, we are going to try topical Pennsaid (diclofenac 2% topical).

## 2017-12-29 ENCOUNTER — Telehealth: Payer: Self-pay | Admitting: Sports Medicine

## 2017-12-29 DIAGNOSIS — M170 Bilateral primary osteoarthritis of knee: Secondary | ICD-10-CM

## 2017-12-29 NOTE — Telephone Encounter (Signed)
-----   Message from Neldon LabellaHolden S Mabe, [place] sent at 12/29/2017  2:31 PM EDT -----  Information has been submitted to Orthovisc and awaiting determination.   ----- Message ----- From: Monica Bectonhekkekandam, Thomas J, MD Sent: 12/26/2017   4:10 PM To: Neldon LabellaHolden S Mabe, CMA  Orthovisc approval please, right knee. ___________________________________________ Ihor Austinhomas J. Benjamin Stainhekkekandam, M.D., ABFM., CAQSM. Primary Care and Sports Medicine [facility]  Adjunct Instructor of Family Medicine  [employer]

## 2017-12-31 ENCOUNTER — Other Ambulatory Visit: Payer: Self-pay | Admitting: Sports Medicine

## 2017-12-31 DIAGNOSIS — F17200 Nicotine dependence, unspecified, uncomplicated: Secondary | ICD-10-CM

## 2017-12-31 DIAGNOSIS — N139 Obstructive and reflux uropathy, unspecified: Secondary | ICD-10-CM

## 2018-01-02 NOTE — Telephone Encounter (Signed)
Patient is agreeable to start the authorization for the Orthovisc. I am submitting all the information to insurance.

## 2018-01-02 NOTE — Telephone Encounter (Signed)
Orthovisc is covered. All services associated with the office visit, injection and product are covered under one copay of $50.00. Specialist Office Visit Copay applies to each successive visit. . Once the out of pocket is met, the patient will have no financial responsibility. Call reference number is [id number].   Left message for patient to call back about message.

## 2018-01-19 ENCOUNTER — Other Ambulatory Visit: Payer: Self-pay

## 2018-01-19 DIAGNOSIS — I25118 Atherosclerotic heart disease of native coronary artery with other forms of angina pectoris: Secondary | ICD-10-CM

## 2018-01-19 DIAGNOSIS — E785 Hyperlipidemia, unspecified: Secondary | ICD-10-CM

## 2018-01-19 MED ORDER — EZETIMIBE 10 MG PO TABS: 10.0000 mg | ORAL_TABLET | Freq: Every day | ORAL | 1 refills | Status: DC

## 2018-01-19 NOTE — Telephone Encounter (Signed)
Called insurance to check the status of the Orthovisc and [facility] Blaine [facility]Sarina K [facility] the form online was not correct and it has to be faxed to our office.

## 2018-01-20 ENCOUNTER — Other Ambulatory Visit: Payer: Self-pay | Admitting: Sports Medicine

## 2018-01-20 MED ORDER — HYLAN G-F 20 16 MG/2ML IX SOSY
PREFILLED_SYRINGE | INTRA_ARTICULAR | 0 refills | Status: DC
Start: 2018-01-20 — End: 2018-05-22

## 2018-01-20 NOTE — Telephone Encounter (Signed)
Called to check for update and per Caryn BeeKevin at [facility] the insurance card did not come through and after looking at the fax machine he found the information needed. I also gave a verbal of the insurance information and he repeated back correctly.

## 2018-01-20 NOTE — Addendum Note (Signed)
Addended by: Arvilla MarketMABE, Amai Cappiello on: 01/20/2018 09:39 AM   Modules accepted: Orders

## 2018-01-26 NOTE — Telephone Encounter (Addendum)
Called pharmacy and spoke with UkraineKara and she said our office should recived a response by the end of the week. Patient has been notified and voices understanding. I told patient that as soon as I had more information I would be back in touch with him.

## 2018-02-03 ENCOUNTER — Other Ambulatory Visit: Payer: Self-pay | Admitting: Sports Medicine

## 2018-02-03 DIAGNOSIS — I208 Other forms of angina pectoris: Secondary | ICD-10-CM

## 2018-02-04 NOTE — Telephone Encounter (Signed)
Called [facility] and per Martie LeeSabrina they are waiting on the patient to call back. I called the patient to let him know the pharmacy has been trying to reach him and he will call back today to give all the information.

## 2018-02-18 NOTE — Telephone Encounter (Signed)
Patient called and advised me that he called the speciality pharmacy has cancelled his order since he had called back to give the ok to place the order.   I then called the pharmacy and per Kennyth ArnoldStacy at the pharmacy she stated there was not an authorization on file and I gave a verbal of the patients insurance and she stated she would expedite this to their department manager.   Patient notified and voices understanding.

## 2018-02-23 DIAGNOSIS — M47816 Spondylosis without myelopathy or radiculopathy, lumbar region: Secondary | ICD-10-CM

## 2018-02-23 DIAGNOSIS — M47812 Spondylosis without myelopathy or radiculopathy, cervical region: Secondary | ICD-10-CM | POA: Diagnosis not present

## 2018-02-23 DIAGNOSIS — Q046 Congenital cerebral cysts: Secondary | ICD-10-CM | POA: Insufficient documentation

## 2018-02-23 DIAGNOSIS — Z982 Presence of cerebrospinal fluid drainage device: Secondary | ICD-10-CM | POA: Diagnosis not present

## 2018-02-23 HISTORY — DX: Congenital cerebral cysts: Q04.6

## 2018-02-23 HISTORY — DX: Spondylosis without myelopathy or radiculopathy, lumbar region: M47.816

## 2018-02-24 NOTE — Telephone Encounter (Signed)
Called [facility] to check on the status of the injections and per Pieter PartridgeMiriam at the pharmacy the order is still in process with the insurance.   Left message for patient with an update on the injections.

## 2018-03-03 NOTE — Telephone Encounter (Signed)
Received a call from [facility] requesting a call back for shipment of the Synvisc injections. Called pharmacy and [facility] that the patient would need to call and give his consent to ship the injections. I called the patient and advised him that he would need to call and patient stated that he had already called them to ship the injections and he will follow up. I advised the patient that I would follow up later today and he voices understanding.

## 2018-03-03 NOTE — Telephone Encounter (Signed)
Spoke with Diedre at [facility] and she confirmed the shipment will be sent out. Patient has been notified.

## 2018-03-04 DIAGNOSIS — M1711 Unilateral primary osteoarthritis, right knee: Secondary | ICD-10-CM | POA: Diagnosis not present

## 2018-03-05 NOTE — Telephone Encounter (Signed)
Injections came in the office and patient has been called to schedule. Injections will be placed in Dr. Karie Schwalbe box and his assistant notified. Patient scheduled for injection.

## 2018-03-09 ENCOUNTER — Other Ambulatory Visit: Payer: Self-pay | Admitting: Sports Medicine

## 2018-03-09 DIAGNOSIS — N139 Obstructive and reflux uropathy, unspecified: Secondary | ICD-10-CM

## 2018-03-11 ENCOUNTER — Other Ambulatory Visit: Payer: Self-pay | Admitting: *Deleted

## 2018-03-11 MED ORDER — MELOXICAM 15 MG PO TABS: ORAL_TABLET | ORAL | 1 refills | Status: DC

## 2018-03-18 ENCOUNTER — Other Ambulatory Visit: Payer: Self-pay | Admitting: Sports Medicine

## 2018-03-27 ENCOUNTER — Ambulatory Visit: Payer: BLUE CROSS/BLUE SHIELD | Admitting: Sports Medicine

## 2018-03-27 VITALS — BP 115/70 | HR 70

## 2018-03-27 DIAGNOSIS — Z23 Encounter for immunization: Secondary | ICD-10-CM

## 2018-03-27 DIAGNOSIS — M170 Bilateral primary osteoarthritis of knee: Secondary | ICD-10-CM

## 2018-03-27 DIAGNOSIS — I2089 Other forms of angina pectoris: Secondary | ICD-10-CM

## 2018-03-27 DIAGNOSIS — I208 Other forms of angina pectoris: Secondary | ICD-10-CM

## 2018-03-27 DIAGNOSIS — N139 Obstructive and reflux uropathy, unspecified: Secondary | ICD-10-CM | POA: Diagnosis not present

## 2018-03-27 MED ORDER — EPINEPHRINE 0.3 MG/0.3ML IJ SOAJ: 0.3000 mg | Freq: Once | INTRAMUSCULAR | 0 refills | Status: DC

## 2018-03-27 MED ORDER — NITROGLYCERIN 0.4 MG SL SUBL: 0.4000 mg | SUBLINGUAL_TABLET | SUBLINGUAL | 0 refills | Status: DC | PRN

## 2018-03-27 MED ORDER — DUTASTERIDE 0.5 MG PO CAPS: 0.5000 mg | ORAL_CAPSULE | Freq: Every day | ORAL | 3 refills | Status: DC

## 2018-03-27 NOTE — Assessment & Plan Note (Signed)
Persistent symptoms on max dose Flomax. Adding Avodart.

## 2018-03-27 NOTE — Progress Notes (Signed)
Subjective:    CC: Knee injection and some other issues  HPI: Knee arthritis: Here for Synvisc injection #1 of 3.  Right-sided.  Obstructive uropathy: Initial good response to Flomax at 0.8 mg.  Now having increasing frequency, urgency, hesitancy, dribbling.  No burning, no fevers or chills, symptoms developed gradually.  I reviewed the past medical history, family history, social history, surgical history, and allergies today and no changes were needed.  Please see the problem list section below in epic for further details.  Past Medical History: Past Medical History:  Diagnosis Date  . Annual physical exam 07/26/2014  . Chest pain 08/25/2017  . History of right peroneus brevis tear post repair 07/26/2014  . Hyperlipidemia 07/26/2014  . Lumbar facet joint syndrome 04/11/2016  . Obstructive uropathy 03/19/2016  . Osteoarthritis of both knees 07/26/2014   Postarthroscopy and partial meniscectomy on the right. Unloader brace on the right. Status post anterior cruciate ligament repair with reconstruction on the left.  Orthovisc approved   . Pain    LEFT SHOULDER- ROTATOR CUFF  TEAR  . Primary osteoarthritis of first carpometacarpal joint of right hand 06/04/2016  . 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 Rotator cuff tear, left 06/18/2012  . S/P ventriculoperitoneal shunt 07/26/2014  . Smoker 08/19/2017  . Stable angina (HCC) 08/19/2017  . 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 Yellow jacket sting allergy    Past Surgical History: Past Surgical History:  Procedure Laterality Date  . ANTERIOR CRUCIATE LIGAMENT REPAIR  1997 OR 1998   LEFT KNEE  . LEFT ANKLE TENDON TRANSFER  FEB 2010  . RIGHT ANKLE TENDON TRANSFER  SEPT 30, 2013   SURGERY [facility] DONE IN [facility]--PT WEARS  ORTHOTIC BOOT WHEN AMBULATING--HEALING INCISION--SOME WEAKNESS STILL  . RIGHT WRIST FIXATION AUG  2006      . SHOULDER ARTHROSCOPY WITH ROTATOR CUFF REPAIR AND SUBACROMIAL DECOMPRESSION  06/18/2012   Procedure: SHOULDER ARTHROSCOPY WITH ROTATOR CUFF REPAIR AND SUBACROMIAL DECOMPRESSION;  Surgeon: Javier DockerJeffrey C Beane, MD;  Location: [facility];  Service: Orthopedics;  Laterality: Left;  Left Shoulder arthroscopy subacromial decompression mini open rotator cuff repair   . VENTRICULOPERITONEAL SHUNT     Social History: Social History   Socioeconomic History  . Marital status: Married    Spouse name: Not on file  . Number of children: Not on file  . Years of education: Not on file  . Highest education level: Not on file  Occupational History  . Not on file  Social Needs  . Financial resource strain: Not on file  . Food insecurity:    Worry: Not on file    Inability: Not on file  . Transportation needs:    Medical: Not on file    Non-medical: Not on file  Tobacco Use  . Smoking status: Current Every Day Smoker    Packs/day: 1.00    Years: 20.00    Pack years: 20.00    Types: Cigarettes  . Smokeless tobacco: Never Used  . Tobacco comment: QUIT SMOKING 2011, has restarted   Substance and Sexual Activity  . Alcohol use: No  . Drug use: No  . Sexual activity: Not on file  Lifestyle  . Physical activity:    Days per week: Not on file    Minutes per session: Not on file  . Stress: Not on file  Relationships  . Social connections:  Talks on phone: Not on file    Gets together: Not on file    Attends religious service: Not on file    Active member of club or organization: Not on file    Attends meetings of clubs or organizations: Not on file    Relationship status: Not on file  Other Topics Concern  . Not on file  Social History Narrative  . Not on file   Family History: Family History  Problem Relation Age of Onset  . Diabetes Father   . CAD Father   . Hyperlipidemia Father   . Arthritis Father   . Arthritis Sister    Allergies: Allergies  Allergen Reactions  . Bee Pollen  Other (See Comments)    syncope  . Bee Venom Other (See Comments)    Numbness *fell x2 and muscles wouldn't work well, 21 Mar 2005   Medications: See med rec.  Review of Systems: No fevers, chills, night sweats, weight loss, chest pain, or shortness of breath.   Objective:    General: Well Developed, well nourished, and in no acute distress.  Neuro: Alert and oriented x3, extra-ocular muscles intact, sensation grossly intact.  HEENT: Normocephalic, atraumatic, pupils equal round reactive to light, neck supple, no masses, no lymphadenopathy, thyroid nonpalpable.  Skin: Warm and dry, no rashes. Cardiac: Regular rate and rhythm, no murmurs rubs or gallops, no lower extremity edema.  Respiratory: Clear to auscultation bilaterally. Not using accessory muscles, speaking in full sentences.  Procedure: Real-time Ultrasound Guided Injection of right knee Device: GE Logiq E  Verbal informed consent obtained.  Time-out conducted.  Noted no overlying erythema, induration, or other signs of local infection.  Skin prepped in a sterile fashion.  Local anesthesia: Topical Ethyl chloride.  With sterile technique and under real time ultrasound guidance: Synvisc injected easily through a 22-gauge needle into the right suprapatellar recess. Completed without difficulty  Pain immediately resolved suggesting accurate placement of the medication.  Advised to call if fevers/chills, erythema, induration, drainage, or persistent bleeding.  Images permanently stored and available for review in the ultrasound unit.  Impression: Technically successful ultrasound guided injection.  Impression and Recommendations:    Primary osteoarthritis of both knees Good response to Orthovisc about 3 years ago, preferred agent this time is Synvisc, first Synvisc injection into the right knee today. Return in 1 week for #2 of 3.  Obstructive uropathy Persistent symptoms on max dose Flomax. Adding Avodart.  I spent 25  minutes with this patient, greater than 50% was face-to-face time counseling regarding the above diagnoses, this was separate from the time spent performing the above procedure, and we discussed the pathophysiology of BPH and the interaction of testosterone and dihydrotestosterone. ___________________________________________ Frederick Ward. Benjamin Stain, M.D., ABFM., CAQSM. Primary Care and Sports Medicine [facility]  Adjunct Instructor of Family Medicine  [employer]

## 2018-03-27 NOTE — Assessment & Plan Note (Signed)
Good response to Orthovisc about 3 years ago, preferred agent this time is Synvisc, first Synvisc injection into the right knee today. Return in 1 week for #2 of 3.

## 2018-03-30 ENCOUNTER — Telehealth: Payer: Self-pay | Admitting: Sports Medicine

## 2018-03-30 NOTE — Telephone Encounter (Signed)
Approvedtoday  Effective from 03/30/2018 through 03/29/2019. Pharmacy aware and patient is aware.

## 2018-04-03 ENCOUNTER — Ambulatory Visit: Payer: BLUE CROSS/BLUE SHIELD | Admitting: Sports Medicine

## 2018-04-03 ENCOUNTER — Encounter: Payer: Self-pay | Admitting: Sports Medicine

## 2018-04-03 DIAGNOSIS — M170 Bilateral primary osteoarthritis of knee: Secondary | ICD-10-CM

## 2018-04-03 NOTE — Assessment & Plan Note (Signed)
Synvisc injection #2 of 3 today, return in 1 week for a #3 of 3.

## 2018-04-03 NOTE — Progress Notes (Signed)
   Procedure: Real-time Ultrasound Guided Injection of right knee Device: GE Logiq E  Verbal informed consent obtained.  Time-out conducted.  Noted no overlying erythema, induration, or other signs of local infection.  Skin prepped in a sterile fashion.  Local anesthesia: Topical Ethyl chloride.  With sterile technique and under real time ultrasound guidance: Synvisc injected easily through a 22-gauge needle. Completed without difficulty  Pain immediately resolved suggesting accurate placement of the medication.  Advised to call if fevers/chills, erythema, induration, drainage, or persistent bleeding.  Images permanently stored and available for review in the ultrasound unit.  Impression: Technically successful ultrasound guided injection.

## 2018-04-08 ENCOUNTER — Other Ambulatory Visit: Payer: Self-pay | Admitting: Sports Medicine

## 2018-04-08 DIAGNOSIS — I208 Other forms of angina pectoris: Secondary | ICD-10-CM

## 2018-04-09 ENCOUNTER — Encounter: Payer: Self-pay | Admitting: Sports Medicine

## 2018-04-09 DIAGNOSIS — M47816 Spondylosis without myelopathy or radiculopathy, lumbar region: Secondary | ICD-10-CM

## 2018-04-09 NOTE — Telephone Encounter (Signed)
Roberta with [facility] notified.  

## 2018-04-09 NOTE — Assessment & Plan Note (Signed)
Did well last year in December with bilateral L4-S1 facet joint injections, per his request we are now going to proceed with medial branch blocks and if efficacious, radio frequency ablation of the bilateral L4-S1 facet joints.

## 2018-04-09 NOTE — Telephone Encounter (Signed)
Please contact [facility] for scheduling of RFA

## 2018-04-10 ENCOUNTER — Other Ambulatory Visit: Payer: Self-pay | Admitting: Sports Medicine

## 2018-04-10 DIAGNOSIS — M47816 Spondylosis without myelopathy or radiculopathy, lumbar region: Secondary | ICD-10-CM

## 2018-04-13 ENCOUNTER — Ambulatory Visit: Payer: BLUE CROSS/BLUE SHIELD | Admitting: Sports Medicine

## 2018-04-16 ENCOUNTER — Ambulatory Visit: Payer: BLUE CROSS/BLUE SHIELD | Admitting: Sports Medicine

## 2018-04-16 DIAGNOSIS — M170 Bilateral primary osteoarthritis of knee: Secondary | ICD-10-CM

## 2018-04-16 NOTE — Progress Notes (Signed)
   Procedure: Real-time Ultrasound Guided Injection of right knee Device: GE Logiq E  Verbal informed consent obtained.  Time-out conducted.  Noted no overlying erythema, induration, or other signs of local infection.  Skin prepped in a sterile fashion.  Local anesthesia: Topical Ethyl chloride.  With sterile technique and under real time ultrasound guidance: Synvisc injected easily through a 22-gauge needle. Completed without difficulty  Pain immediately resolved suggesting accurate placement of the medication.  Advised to call if fevers/chills, erythema, induration, drainage, or persistent bleeding.  Images permanently stored and available for review in the ultrasound unit.  Impression: Technically successful ultrasound guided injection. 

## 2018-04-16 NOTE — Assessment & Plan Note (Signed)
Synvisc injection #3 of 3 right knee. Return in 1 month.

## 2018-04-29 ENCOUNTER — Other Ambulatory Visit: Payer: Self-pay | Admitting: Sports Medicine

## 2018-04-29 DIAGNOSIS — N139 Obstructive and reflux uropathy, unspecified: Secondary | ICD-10-CM

## 2018-05-01 ENCOUNTER — Ambulatory Visit
Admission: RE | Admit: 2018-05-01 | Discharge: 2018-05-01 | Disposition: A | Discharge status: Home / Self Care | Payer: BLUE CROSS/BLUE SHIELD | Source: Ambulatory Visit | Attending: Sports Medicine | Admitting: Sports Medicine

## 2018-05-01 ENCOUNTER — Other Ambulatory Visit: Payer: Self-pay | Admitting: Sports Medicine

## 2018-05-01 DIAGNOSIS — M47816 Spondylosis without myelopathy or radiculopathy, lumbar region: Secondary | ICD-10-CM

## 2018-05-01 DIAGNOSIS — M545 Low back pain: Secondary | ICD-10-CM | POA: Diagnosis not present

## 2018-05-01 NOTE — Discharge Instructions (Signed)

## 2018-05-18 NOTE — Discharge Instructions (Signed)
Radio Frequency Ablation Post Procedure Discharge Instructions ° °1. May resume a regular diet and any medications that you routinely take (including pain medications). °2. No driving day of procedure. °3. Upon discharge go home and rest for at least 4 hours.  May use an ice pack as needed to injection sites on back. °4. Remove bandades later, today. ° ° ° °Please contact our office at [phone] for the following symptoms: ° °· Fever greater than 100 degrees °· Increased swelling, pain, or redness at injection site. ° ° °Thank you for visiting [facility]. °

## 2018-05-19 ENCOUNTER — Ambulatory Visit
Admission: RE | Admit: 2018-05-19 | Discharge: 2018-05-19 | Disposition: A | Discharge status: Home / Self Care | Payer: BLUE CROSS/BLUE SHIELD | Source: Ambulatory Visit | Attending: Sports Medicine | Admitting: Sports Medicine

## 2018-05-19 ENCOUNTER — Other Ambulatory Visit: Payer: Self-pay | Admitting: Sports Medicine

## 2018-05-19 DIAGNOSIS — M47816 Spondylosis without myelopathy or radiculopathy, lumbar region: Secondary | ICD-10-CM

## 2018-05-19 MED ORDER — METHYLPREDNISOLONE ACETATE 40 MG/ML INJ SUSP (RADIOLOG
120.0000 mg | Freq: Once | INTRAMUSCULAR | Status: AC
  Administered 2018-05-19: 120 mg via INTRA_ARTICULAR

## 2018-05-19 MED ORDER — KETOROLAC TROMETHAMINE 30 MG/ML IJ SOLN
30.0000 mg | Freq: Once | INTRAMUSCULAR | Status: AC
  Administered 2018-05-19: 30 mg via INTRAVENOUS

## 2018-05-19 MED ORDER — MIDAZOLAM HCL 2 MG/2ML IJ SOLN
1.0000 mg | INTRAMUSCULAR | Status: DC | PRN
  Administered 2018-05-19: 1 mg via INTRAVENOUS

## 2018-05-19 MED ORDER — SODIUM CHLORIDE 0.9 % IV SOLN
Freq: Once | INTRAVENOUS | Status: AC
  Administered 2018-05-19: 08:00:00 via INTRAVENOUS

## 2018-05-19 MED ORDER — FENTANYL CITRATE (PF) 100 MCG/2ML IJ SOLN
25.0000 ug | INTRAMUSCULAR | Status: DC | PRN
  Administered 2018-05-19: 50 ug via INTRAVENOUS

## 2018-05-22 ENCOUNTER — Other Ambulatory Visit: Payer: BLUE CROSS/BLUE SHIELD

## 2018-05-22 ENCOUNTER — Encounter: Payer: Self-pay | Admitting: Sports Medicine

## 2018-05-22 ENCOUNTER — Other Ambulatory Visit: Payer: Self-pay | Admitting: Sports Medicine

## 2018-05-22 ENCOUNTER — Ambulatory Visit: Payer: BLUE CROSS/BLUE SHIELD | Admitting: Sports Medicine

## 2018-05-22 DIAGNOSIS — M5030 Other cervical disc degeneration, unspecified cervical region: Secondary | ICD-10-CM | POA: Insufficient documentation

## 2018-05-22 DIAGNOSIS — M170 Bilateral primary osteoarthritis of knee: Secondary | ICD-10-CM | POA: Diagnosis not present

## 2018-05-22 DIAGNOSIS — F17200 Nicotine dependence, unspecified, uncomplicated: Secondary | ICD-10-CM

## 2018-05-22 DIAGNOSIS — M47816 Spondylosis without myelopathy or radiculopathy, lumbar region: Secondary | ICD-10-CM | POA: Diagnosis not present

## 2018-05-22 DIAGNOSIS — B353 Tinea pedis: Secondary | ICD-10-CM | POA: Diagnosis not present

## 2018-05-22 HISTORY — DX: Other cervical disc degeneration, unspecified cervical region: M50.30

## 2018-05-22 HISTORY — DX: Tinea pedis: B35.3

## 2018-05-22 MED ORDER — TERBINAFINE HCL 250 MG PO TABS: 250.0000 mg | ORAL_TABLET | Freq: Every day | ORAL | 0 refills | Status: DC

## 2018-05-22 MED ORDER — OXYCODONE HCL 10 MG PO TABS: 10.0000 mg | ORAL_TABLET | Freq: Three times a day (TID) | ORAL | 0 refills | Status: DC | PRN

## 2018-05-22 NOTE — Assessment & Plan Note (Signed)
With left peri-scapular radicular symptoms, adding cervical spine rehab exercises.

## 2018-05-22 NOTE — Assessment & Plan Note (Signed)
Pain-free after Synvisc No. 3 of 3 into the right knee a month ago, a little bit of popping but no pain. Adding some knee rehab exercises.

## 2018-05-22 NOTE — Assessment & Plan Note (Signed)
Overall doing okay post L4-S1 bilateral facet joint radiofrequency ablation a few days ago.

## 2018-05-22 NOTE — Progress Notes (Signed)
Subjective:    CC: Follow-up  HPI: Lumbar facet syndrome: Post L4-S1 bilateral facet radiofrequency ablation, doing well.  Knee osteoarthritis: Pain-free now after series of Synvisc.  Foot rash: Left-sided, has used a pumice stone for months, continues to come back.  Has tried some topical antifungals without efficacy.  Neck pain: With left periscapular radiating discomfort, worse with prolonged downgaze.  I reviewed the past medical history, family history, social history, surgical history, and allergies today and no changes were needed.  Please see the problem list section below in epic for further details.  Past Medical History: Past Medical History:  Diagnosis Date  . Annual physical exam 07/26/2014  . Chest pain 08/25/2017  . History of right peroneus brevis tear post repair 07/26/2014  . Hyperlipidemia 07/26/2014  . Lumbar facet joint syndrome 04/11/2016  . Obstructive uropathy 03/19/2016  . Osteoarthritis of both knees 07/26/2014   Postarthroscopy and partial meniscectomy on the right. Unloader brace on the right. Status post anterior cruciate ligament repair with reconstruction on the left.  Orthovisc approved   . Pain    LEFT SHOULDER- ROTATOR CUFF  TEAR  . Primary osteoarthritis of first carpometacarpal joint of right hand 06/04/2016  . 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 Rotator cuff tear, left 06/18/2012  . S/P ventriculoperitoneal shunt 07/26/2014  . Smoker 08/19/2017  . Stable angina (HCC) 08/19/2017  . 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 Yellow jacket sting allergy    Past Surgical History: Past Surgical History:  Procedure Laterality Date  . ANTERIOR CRUCIATE LIGAMENT REPAIR  1997 OR 1998   LEFT KNEE  . LEFT ANKLE TENDON TRANSFER  FEB 2010  . RIGHT ANKLE TENDON TRANSFER  SEPT 30, 2013   SURGERY WAS DONE IN [facility]--PT WEARS  ORTHOTIC BOOT  WHEN AMBULATING--HEALING INCISION--SOME WEAKNESS STILL  . RIGHT WRIST FIXATION AUG  2006    . SHOULDER ARTHROSCOPY WITH ROTATOR CUFF REPAIR AND SUBACROMIAL DECOMPRESSION  06/18/2012   Procedure: SHOULDER ARTHROSCOPY WITH ROTATOR CUFF REPAIR AND SUBACROMIAL DECOMPRESSION;  Surgeon: Javier Docker, MD;  Location: [facility];  Service: Orthopedics;  Laterality: Left;  Left Shoulder arthroscopy subacromial decompression mini open rotator cuff repair   . VENTRICULOPERITONEAL SHUNT     Social History: Social History   Socioeconomic History  . Marital status: Married    Spouse name: Not on file  . Number of children: Not on file  . Years of education: Not on file  . Highest education level: Not on file  Occupational History  . Not on file  Social Needs  . Financial resource strain: Not on file  . Food insecurity:    Worry: Not on file    Inability: Not on file  . Transportation needs:    Medical: Not on file    Non-medical: Not on file  Tobacco Use  . Smoking status: Current Every Day Smoker    Packs/day: 1.00    Years: 20.00    Pack years: 20.00    Types: Cigarettes  . Smokeless tobacco: Never Used  . Tobacco comment: QUIT SMOKING 2011, has restarted   Substance and Sexual Activity  . Alcohol use: No  . Drug use: No  . Sexual activity: Not on file  Lifestyle  . Physical activity:    Days per week: Not on file    Minutes per session: Not on file  . Stress:  Not on file  Relationships  . Social connections:    Talks on phone: Not on file    Gets together: Not on file    Attends religious service: Not on file    Active member of club or organization: Not on file    Attends meetings of clubs or organizations: Not on file    Relationship status: Not on file  Other Topics Concern  . Not on file  Social History Narrative  . Not on file   Family History: Family History  Problem Relation Age of Onset  . Diabetes Father   . CAD Father   . Hyperlipidemia Father   . Arthritis  Father   . Arthritis Sister    Allergies: Allergies  Allergen Reactions  . Bee Venom Other (See Comments)    Numbness and syncope *fell x2 and muscles wouldn't work well, 21 Mar 2005   Medications: See med rec.  Review of Systems: No fevers, chills, night sweats, weight loss, chest pain, or shortness of breath.   Objective:    General: Well Developed, well nourished, and in no acute distress.  Neuro: Alert and oriented x3, extra-ocular muscles intact, sensation grossly intact.  HEENT: Normocephalic, atraumatic, pupils equal round reactive to light, neck supple, no masses, no lymphadenopathy, thyroid nonpalpable.  Skin: Warm and dry, scaly rash on the bottom of the left foot.  No signs of bacterial superinfection. Cardiac: Regular rate and rhythm, no murmurs rubs or gallops, no lower extremity edema.  Respiratory: Clear to auscultation bilaterally. Not using accessory muscles, speaking in full sentences. Neck: Negative spurling's Full neck range of motion Grip strength and sensation normal in bilateral hands Strength good C4 to T1 distribution No sensory change to C4 to T1 Reflexes normal  Impression and Recommendations:    Lumbar facet joint syndrome Overall doing okay post L4-S1 bilateral facet joint radiofrequency ablation a few days ago.  Primary osteoarthritis of both knees Pain-free after Synvisc No. 3 of 3 into the right knee a month ago, a little bit of popping but no pain. Adding some knee rehab exercises.  DDD (degenerative disc disease), cervical With left peri-scapular radicular symptoms, adding cervical spine rehab exercises.  Tinea pedis, left Lamisil for 1 month, return to recheck. ___________________________________________ Frederick Ward. Benjamin Stain, M.D., ABFM., CAQSM. Primary Care and Sports Medicine [facility]  Adjunct Professor of Family Medicine  [employer]

## 2018-05-22 NOTE — Patient Instructions (Signed)
Tinea Pedis Tinea Pedis is a fungal infection of the skin on the feet. It often occurs on the skin that is between or underneath the toes. It can also occur on the soles of the feet. The infection can spread from person to person (is contagious). What are the causes? Athlete's foot is caused by a fungus. This fungus grows in warm, moist places. Most people get athlete's foot by sharing shower stalls, towels, and wet floors with someone who is infected. Not washing your feet or changing your socks often enough can contribute to athlete's foot. What increases the risk? This condition is more likely to develop in:  Men.  People who have a weak body defense system (immune system).  People who have diabetes.  People who use public showers, such as at a gym.  People who wear heavy-duty shoes, such as Youth worker.  Seasons with warm, humid weather.  What are the signs or symptoms? Symptoms of this condition include:  Itchy areas between the toes or on the soles of the feet.  White, flaky, or scaly areas between the toes or on the soles of the feet.  Very itchy small blisters between the toes or on the soles of the feet.  Small cuts on the skin. These cuts can become infected.  Thick or discolored toenails.  How is this diagnosed? This condition is diagnosed with a medical history and physical exam. Your health care provider may also take a skin or toenail sample to be examined. How is this treated? Treatment for this condition includes antifungal medicines. These may be applied as powders, ointments, or creams. In severe cases, an oral antifungal medicine may be given. Follow these instructions at home:  Apply or take over-the-counter and prescription medicines only as told by your health care provider.  Keep all follow-up visits as told by your health care provider. This is important.  Do not scratch your feet.  Keep your feet dry: ? Wear cotton or wool socks.  Change your socks every day or if they become wet. ? Wear shoes that allow air to circulate, such as sandals or canvas tennis shoes.  Wash and dry your feet: ? Every day or as told by your health care provider. ? After exercising. ? Including the area between your toes.  Do not share towels, nail clippers, or other personal items that touch your feet with others.  If you have diabetes, keep your blood sugar under control. How is this prevented?  Do not share towels.  Wear sandals in wet areas, such as locker rooms and shared showers.  Keep your feet dry: ? Wear cotton or wool socks. Change your socks every day or if they become wet. ? Wear shoes that allow air to circulate, such as sandals or canvas tennis shoes.  Wash and dry your feet after exercising. Pay attention to the area between your toes. Contact a health care provider if:  You have a fever.  You have swelling, soreness, warmth, or redness in your foot.  You are not getting better with treatment.  Your symptoms get worse.  You have new symptoms. This information is not intended to replace advice given to you by your health care provider. Make sure you discuss any questions you have with your health care provider. Document Released: 06/28/2000 Document Revised: 12/07/2015 Document Reviewed: 01/02/2015 Elsevier Interactive Patient Education  2018 [employer].

## 2018-05-22 NOTE — Assessment & Plan Note (Signed)
Lamisil for 1 month, return to recheck.

## 2018-06-04 ENCOUNTER — Other Ambulatory Visit: Payer: Self-pay | Admitting: Sports Medicine

## 2018-06-04 DIAGNOSIS — I208 Other forms of angina pectoris: Secondary | ICD-10-CM

## 2018-06-04 DIAGNOSIS — I2089 Other forms of angina pectoris: Secondary | ICD-10-CM

## 2018-06-04 MED ORDER — METOPROLOL SUCCINATE ER 50 MG PO TB24: 50.0000 mg | ORAL_TABLET | Freq: Every day | ORAL | 3 refills | Status: DC

## 2018-06-19 ENCOUNTER — Encounter: Payer: Self-pay | Admitting: Sports Medicine

## 2018-06-19 ENCOUNTER — Ambulatory Visit: Payer: BLUE CROSS/BLUE SHIELD | Admitting: Sports Medicine

## 2018-06-19 DIAGNOSIS — M5030 Other cervical disc degeneration, unspecified cervical region: Secondary | ICD-10-CM | POA: Diagnosis not present

## 2018-06-19 DIAGNOSIS — B353 Tinea pedis: Secondary | ICD-10-CM

## 2018-06-19 DIAGNOSIS — M170 Bilateral primary osteoarthritis of knee: Secondary | ICD-10-CM | POA: Diagnosis not present

## 2018-06-19 MED ORDER — TERBINAFINE HCL 250 MG PO TABS: 250.0000 mg | ORAL_TABLET | Freq: Every day | ORAL | 1 refills | Status: DC

## 2018-06-19 NOTE — Progress Notes (Signed)
Subjective:    CC: Follow-up  HPI: Cervical DDD: With left periscapular radicular symptoms, overall feels well when he keeps his posture appropriate.  Tinea pedis: Has had 1 month of Lamisil, slight improvement.  Knee osteoarthritis: Did well with Synvisc, he has been in a knee OA unloader brace, this is not low enough profile to fit under his pants.  He is looking for something a lower profile.  I reviewed the past medical history, family history, social history, surgical history, and allergies today and no changes were needed.  Please see the problem list section below in epic for further details.  Past Medical History: Past Medical History:  Diagnosis Date  . Annual physical exam 07/26/2014  . Chest pain 08/25/2017  . History of right peroneus brevis tear post repair 07/26/2014  . Hyperlipidemia 07/26/2014  . Lumbar facet joint syndrome 04/11/2016  . Obstructive uropathy 03/19/2016  . Osteoarthritis of both knees 07/26/2014   Postarthroscopy and partial meniscectomy on the right. Unloader brace on the right. Status post anterior cruciate ligament repair with reconstruction on the left.  Orthovisc approved   . Pain    LEFT SHOULDER- ROTATOR CUFF  TEAR  . Primary osteoarthritis of first carpometacarpal joint of right hand 06/04/2016  . 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 Rotator cuff tear, left 06/18/2012  . S/P ventriculoperitoneal shunt 07/26/2014  . Smoker 08/19/2017  . Stable angina (HCC) 08/19/2017  . 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 Yellow jacket sting allergy    Past Surgical History: Past Surgical History:  Procedure Laterality Date  . ANTERIOR CRUCIATE LIGAMENT REPAIR  1997 OR 1998   LEFT KNEE  . LEFT ANKLE TENDON TRANSFER  FEB 2010  . RIGHT ANKLE TENDON TRANSFER  SEPT 30, 2013   SURGERY [facility] DONE IN [facility]--PT WEARS  ORTHOTIC BOOT WHEN  AMBULATING--HEALING INCISION--SOME WEAKNESS STILL  . RIGHT WRIST FIXATION AUG  2006    . SHOULDER ARTHROSCOPY WITH ROTATOR CUFF REPAIR AND SUBACROMIAL DECOMPRESSION  06/18/2012   Procedure: SHOULDER ARTHROSCOPY WITH ROTATOR CUFF REPAIR AND SUBACROMIAL DECOMPRESSION;  Surgeon: Javier Docker, MD;  Location: [facility];  Service: Orthopedics;  Laterality: Left;  Left Shoulder arthroscopy subacromial decompression mini open rotator cuff repair   . VENTRICULOPERITONEAL SHUNT     Social History: Social History   Socioeconomic History  . Marital status: Married    Spouse name: Not on file  . Number of children: Not on file  . Years of education: Not on file  . Highest education level: Not on file  Occupational History  . Not on file  Social Needs  . Financial resource strain: Not on file  . Food insecurity:    Worry: Not on file    Inability: Not on file  . Transportation needs:    Medical: Not on file    Non-medical: Not on file  Tobacco Use  . Smoking status: Former Smoker    Packs/day: 1.00    Years: 20.00    Pack years: 20.00    Types: Cigarettes  . Smokeless tobacco: Never Used  . Tobacco comment: QUIT SMOKING 2011, has restarted   Substance and Sexual Activity  . Alcohol use: No  . Drug use: No  . Sexual activity: Not on file  Lifestyle  . Physical activity:    Days per week: Not on file    Minutes per session:  Not on file  . Stress: Not on file  Relationships  . Social connections:    Talks on phone: Not on file    Gets together: Not on file    Attends religious service: Not on file    Active member of club or organization: Not on file    Attends meetings of clubs or organizations: Not on file    Relationship status: Not on file  Other Topics Concern  . Not on file  Social History Narrative  . Not on file   Family History: Family History  Problem Relation Age of Onset  . Diabetes Father   . CAD Father   . Hyperlipidemia Father   . Arthritis Father   .  Arthritis Sister    Allergies: Allergies  Allergen Reactions  . Bee Venom Other (See Comments)    Numbness and syncope *fell x2 and muscles wouldn't work well, 21 Mar 2005   Medications: See med rec.  Review of Systems: No fevers, chills, night sweats, weight loss, chest pain, or shortness of breath.   Objective:    General: Well Developed, well nourished, and in no acute distress.  Neuro: Alert and oriented x3, extra-ocular muscles intact, sensation grossly intact.  HEENT: Normocephalic, atraumatic, pupils equal round reactive to light, neck supple, no masses, no lymphadenopathy, thyroid nonpalpable.  Skin: Warm and dry, no rashes.  Slight scaliness on the bottom of the left foot. Cardiac: Regular rate and rhythm, no murmurs rubs or gallops, no lower extremity edema.  Respiratory: Clear to auscultation bilaterally. Not using accessory muscles, speaking in full sentences.  Impression and Recommendations:    DDD (degenerative disc disease), cervical With left periscapular radicular symptoms, slightly improved. It does tend to do better when he is cognizant about his posture, I have advised him to work hard on this before we consider MRIs and injections.  Tinea pedis, left Slight improvement, I do think we need another 1 to 2 months of Lamisil oral.  Primary osteoarthritis of both knees Was doing well after Synvisc several months ago. Had a bit of popping. He was doing the unloader brace on the right which is somewhat difficult to tolerate during the winter and wear under his clothes. He will look into it. ___________________________________________ Ihor Austinhomas J. Benjamin Stainhekkekandam, M.D., ABFM., CAQSM. Primary Care and Sports Medicine [facility]  Adjunct Professor of Family Medicine  [employer]

## 2018-06-19 NOTE — Assessment & Plan Note (Signed)
Slight improvement, I do think we need another 1 to 2 months of Lamisil oral.

## 2018-06-19 NOTE — Assessment & Plan Note (Signed)
Was doing well after Synvisc several months ago. Had a bit of popping. He was doing the unloader brace on the right which is somewhat difficult to tolerate during the winter and wear under his clothes. He will look into it.

## 2018-06-19 NOTE — Assessment & Plan Note (Signed)
With left periscapular radicular symptoms, slightly improved. It does tend to do better when he is cognizant about his posture, I have advised him to work hard on this before we consider MRIs and injections.

## 2018-07-06 ENCOUNTER — Other Ambulatory Visit: Payer: Self-pay

## 2018-07-06 DIAGNOSIS — N139 Obstructive and reflux uropathy, unspecified: Secondary | ICD-10-CM

## 2018-07-06 MED ORDER — DUTASTERIDE 0.5 MG PO CAPS: 0.5000 mg | ORAL_CAPSULE | Freq: Every day | ORAL | 2 refills | Status: DC

## 2018-07-06 MED ORDER — TAMSULOSIN HCL 0.4 MG PO CAPS: ORAL_CAPSULE | ORAL | 2 refills | Status: DC

## 2018-07-07 ENCOUNTER — Other Ambulatory Visit: Payer: Self-pay | Admitting: *Deleted

## 2018-07-07 MED ORDER — ROSUVASTATIN CALCIUM 20 MG PO TABS: 20.0000 mg | ORAL_TABLET | Freq: Every day | ORAL | 1 refills | Status: DC

## 2018-07-07 NOTE — Telephone Encounter (Signed)
Refill for rosuvastatin sent as requested to [facility].

## 2018-07-21 ENCOUNTER — Ambulatory Visit (INDEPENDENT_AMBULATORY_CARE_PROVIDER_SITE_OTHER): Payer: BLUE CROSS/BLUE SHIELD

## 2018-07-21 ENCOUNTER — Ambulatory Visit: Payer: BLUE CROSS/BLUE SHIELD | Admitting: Sports Medicine

## 2018-07-21 ENCOUNTER — Encounter: Payer: Self-pay | Admitting: Sports Medicine

## 2018-07-21 DIAGNOSIS — M25561 Pain in right knee: Secondary | ICD-10-CM | POA: Diagnosis not present

## 2018-07-21 DIAGNOSIS — M170 Bilateral primary osteoarthritis of knee: Secondary | ICD-10-CM

## 2018-07-21 DIAGNOSIS — B353 Tinea pedis: Secondary | ICD-10-CM | POA: Diagnosis not present

## 2018-07-21 DIAGNOSIS — N139 Obstructive and reflux uropathy, unspecified: Secondary | ICD-10-CM

## 2018-07-21 DIAGNOSIS — M1711 Unilateral primary osteoarthritis, right knee: Secondary | ICD-10-CM | POA: Diagnosis not present

## 2018-07-21 DIAGNOSIS — M25562 Pain in left knee: Secondary | ICD-10-CM | POA: Diagnosis not present

## 2018-07-21 DIAGNOSIS — M1712 Unilateral primary osteoarthritis, left knee: Secondary | ICD-10-CM | POA: Diagnosis not present

## 2018-07-21 NOTE — Progress Notes (Signed)
Subjective:    CC: Recheck feet  HPI: This is a pleasant 48 year old male, we treated him with Lamisil for a couple of months, tinea pedis has resolved.  Right knee pain: Known osteoarthritis, likely meniscal tear, we finished Synvisc several months ago, he had a partial response.  Obstructive uropathy: Fantastic response to Flomax and Avodart unfortunately after a few months of Avodart he has noticed decreased sex drive.  I reviewed the past medical history, family history, social history, surgical history, and allergies today and no changes were needed.  Please see the problem list section below in epic for further details.  Past Medical History: Past Medical History:  Diagnosis Date  . Annual physical exam 07/26/2014  . Chest pain 08/25/2017  . History of right peroneus brevis tear post repair 07/26/2014  . Hyperlipidemia 07/26/2014  . Lumbar facet joint syndrome 04/11/2016  . Obstructive uropathy 03/19/2016  . Osteoarthritis of both knees 07/26/2014   Postarthroscopy and partial meniscectomy on the right. Unloader brace on the right. Status post anterior cruciate ligament repair with reconstruction on the left.  Orthovisc approved   . Pain    LEFT SHOULDER- ROTATOR CUFF  TEAR  . Primary osteoarthritis of first carpometacarpal joint of right hand 06/04/2016  . 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 Rotator cuff tear, left 06/18/2012  . S/P ventriculoperitoneal shunt 07/26/2014  . Smoker 08/19/2017  . Stable angina (HCC) 08/19/2017  . 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 Yellow jacket sting allergy    Past Surgical History: Past Surgical History:  Procedure Laterality Date  . ANTERIOR CRUCIATE LIGAMENT REPAIR  1997 OR 1998   LEFT KNEE  . LEFT ANKLE TENDON TRANSFER  FEB 2010  . RIGHT ANKLE TENDON TRANSFER  SEPT 30, 2013   SURGERY WAS DONE IN [facility]--PT WEARS   ORTHOTIC BOOT WHEN AMBULATING--HEALING INCISION--SOME WEAKNESS STILL  . RIGHT WRIST FIXATION AUG  2006    . SHOULDER ARTHROSCOPY WITH ROTATOR CUFF REPAIR AND SUBACROMIAL DECOMPRESSION  06/18/2012   Procedure: SHOULDER ARTHROSCOPY WITH ROTATOR CUFF REPAIR AND SUBACROMIAL DECOMPRESSION;  Surgeon: Javier Docker, MD;  Location: [facility];  Service: Orthopedics;  Laterality: Left;  Left Shoulder arthroscopy subacromial decompression mini open rotator cuff repair   . VENTRICULOPERITONEAL SHUNT     Social History: Social History   Socioeconomic History  . Marital status: Married    Spouse name: Not on file  . Number of children: Not on file  . Years of education: Not on file  . Highest education level: Not on file  Occupational History  . Not on file  Social Needs  . Financial resource strain: Not on file  . Food insecurity:    Worry: Not on file    Inability: Not on file  . Transportation needs:    Medical: Not on file    Non-medical: Not on file  Tobacco Use  . Smoking status: Former Smoker    Packs/day: 1.00    Years: 20.00    Pack years: 20.00    Types: Cigarettes  . Smokeless tobacco: Never Used  . Tobacco comment: QUIT SMOKING 2011, has restarted   Substance and Sexual Activity  . Alcohol use: No  . Drug use: No  . Sexual activity: Not on file  Lifestyle  . Physical activity:    Days per week: Not on file    Minutes per session:  Not on file  . Stress: Not on file  Relationships  . Social connections:    Talks on phone: Not on file    Gets together: Not on file    Attends religious service: Not on file    Active member of club or organization: Not on file    Attends meetings of clubs or organizations: Not on file    Relationship status: Not on file  Other Topics Concern  . Not on file  Social History Narrative  . Not on file   Family History: Family History  Problem Relation Age of Onset  . Diabetes Father   . CAD Father   . Hyperlipidemia Father   . Arthritis  Father   . Arthritis Sister    Allergies: Allergies  Allergen Reactions  . Bee Venom Other (See Comments)    Numbness and syncope *fell x2 and muscles wouldn't work well, 21 Mar 2005   Medications: See med rec.  Review of Systems: No fevers, chills, night sweats, weight loss, chest pain, or shortness of breath.   Objective:    General: Well Developed, well nourished, and in no acute distress.  Neuro: Alert and oriented x3, extra-ocular muscles intact, sensation grossly intact.  HEENT: Normocephalic, atraumatic, pupils equal round reactive to light, neck supple, no masses, no lymphadenopathy, thyroid nonpalpable.  Skin: Warm and dry, no rashes.  Specifically feet appear normal, no further signs of tinea pedis, no scaliness. Cardiac: Regular rate and rhythm, no murmurs rubs or gallops, no lower extremity edema.  Respiratory: Clear to auscultation bilaterally. Not using accessory muscles, speaking in full sentences.  Impression and Recommendations:    Primary osteoarthritis of both knees Did okay after Synvisc several months ago, still having some popping and discomfort mostly in the right knee. Had difficulty tolerating the unloader brace. Adding updated x-rays though I do think we are going to need an MRI for arthroscopy planning.  Tinea pedis, left Resolved with Lamisil.  Obstructive uropathy Has done extremely well with Avodart and Flomax. Unfortunately after a great period of time and Avodart he is noticing a decrease in his sex drive. Decrease Avodart to every other day, if no improvement in sex drive over the few months he can discontinue. ___________________________________________ [name]. Benjamin Stain, M.D., ABFM., CAQSM. Primary Care and Sports Medicine [facility]  Adjunct Professor of Family Medicine  [employer]

## 2018-07-21 NOTE — Assessment & Plan Note (Signed)
Has done extremely well with Avodart and Flomax. Unfortunately after a great period of time and Avodart he is noticing a decrease in his sex drive. Decrease Avodart to every other day, if no improvement in sex drive over the few months he can discontinue.

## 2018-07-21 NOTE — Assessment & Plan Note (Signed)
Did okay after Synvisc several months ago, still having some popping and discomfort mostly in the right knee. Had difficulty tolerating the unloader brace. Adding updated x-rays though I do think we are going to need an MRI for arthroscopy planning.

## 2018-07-21 NOTE — Assessment & Plan Note (Signed)
Resolved with Lamisil.

## 2018-08-05 ENCOUNTER — Other Ambulatory Visit: Payer: Self-pay

## 2018-08-05 DIAGNOSIS — E785 Hyperlipidemia, unspecified: Secondary | ICD-10-CM

## 2018-08-05 DIAGNOSIS — I25118 Atherosclerotic heart disease of native coronary artery with other forms of angina pectoris: Secondary | ICD-10-CM

## 2018-08-05 MED ORDER — EZETIMIBE 10 MG PO TABS: 10.0000 mg | ORAL_TABLET | Freq: Every day | ORAL | 1 refills | Status: DC

## 2018-10-08 ENCOUNTER — Other Ambulatory Visit: Payer: Self-pay | Admitting: Sports Medicine

## 2018-10-08 DIAGNOSIS — F17200 Nicotine dependence, unspecified, uncomplicated: Secondary | ICD-10-CM

## 2018-11-09 ENCOUNTER — Telehealth: Payer: Self-pay | Admitting: Cardiology

## 2018-11-09 NOTE — Telephone Encounter (Signed)
Cardiac Questionnaire:    Since your last visit or hospitalization:    1. Have you been having new or worsening chest pain? no   2. Have you been having new or worsening shortness of breath?no 3. Have you been having new or worsening leg swelling, wt gain, or increase in abdominal girth (pants fitting more tightly)? no   4. Have you had any passing out spells? no    *A YES to any of these questions would result in the appointment being kept. *If all the answers to these questions are NO, we should indicate that given the current situation regarding the worldwide coronarvirus pandemic, at the recommendation of the CDC, we are looking to limit gatherings in our waiting area, and thus will reschedule their appointment beyond four weeks from today.   _____________   COVID-19 Pre-Screening Questions:   Do you currently have a fever? no  Have you recently travelled on a cruise, internationally, or to [place], [place], [place], [place], [place], or [place], [place]) ? no  Have you been in contact with someone that is currently pending confirmation of Covid19 testing or has been confirmed to have the Covid19 virus?  no  Are you currently experiencing fatigue or cough? Fatigue with sciatica     Virtual Visit Pre-Appointment Phone Call  "(Name), I am calling you today to discuss your upcoming appointment. We are currently trying to limit exposure to the virus that causes COVID-19 by seeing patients at home rather than in the office."  1. "What is the BEST phone number to call the day of the visit?" - include this in appointment notes  2. Do you have or have access to (through a family member/friend) a smartphone with video capability that we can use for your visit?" a. If yes - list this number in appt notes as cell (if different from BEST phone #) and list the appointment type as a VIDEO visit in appointment notes b. If no - list the appointment type as a PHONE visit in appointment  notes  3. Confirm consent - "In the setting of the current Covid19 crisis, you are scheduled for a (phone or video) visit with your provider on (date) at (time).  Just as we do with many in-office visits, in order for you to participate in this visit, we must obtain consent.  If you'd like, I can send this to your mychart (if signed up) or email for you to review.  Otherwise, I can obtain your verbal consent now.  All virtual visits are billed to your insurance company just like a normal visit would be.  By agreeing to a virtual visit, we'd like you to understand that the technology does not allow for your provider to perform an examination, and thus may limit your provider's ability to fully assess your condition. If your provider identifies any concerns that need to be evaluated in person, we will make arrangements to do so.  Finally, though the technology is pretty good, we cannot assure that it will always work on either your or our end, and in the setting of a video visit, we may have to convert it to a phone-only visit.  In either situation, we cannot ensure that we have a secure connection.  Are you willing to proceed?" STAFF: Did the patient verbally acknowledge consent to telehealth visit? Document YES/NO here: Yes  4. Advise patient to be prepared - "Two hours prior to your appointment, go ahead and check your blood pressure, pulse,  oxygen saturation, and your weight (if you have the equipment to check those) and write them all down. When your visit starts, your provider will ask you for this information. If you have an Apple Watch or Kardia device, please plan to have heart rate information ready on the day of your appointment. Please have a pen and paper handy nearby the day of the visit as well."  5. Give patient instructions for MyChart download to smartphone OR [email or web address] as below if video visit (depending on what platform provider is using)  6. Inform patient they will receive a phone  call 15 minutes prior to their appointment time (may be from unknown caller ID) so they should be prepared to answer    TELEPHONE CALL NOTE  Jomo Forand has been deemed a candidate for a follow-up tele-health visit to limit community exposure during the Covid-19 pandemic. I spoke with the patient via phone to ensure availability of phone/video source, confirm preferred email & phone number, and discuss instructions and expectations.  I reminded Dayshon Roback to be prepared with any vital sign and/or heart rhythm information that could potentially be obtained via home monitoring, at the time of his visit. I reminded Frederick Ward to expect a phone call prior to his visit.  Alvy Beal 11/09/2018 9:19 AM    FULL LENGTH CONSENT FOR TELE-HEALTH VISIT   I hereby voluntarily request, consent and authorize [facility] and its employed or contracted physicians, physician assistants, nurse practitioners or other licensed health care professionals (the Practitioner), to provide me with telemedicine health care services (the Services") as deemed necessary by the treating Practitioner. I acknowledge and consent to receive the Services by the Practitioner via telemedicine. I understand that the telemedicine visit will involve communicating with the Practitioner through live audiovisual communication technology and the disclosure of certain medical information by electronic transmission. I acknowledge that I have been given the opportunity to request an in-person assessment or other available alternative prior to the telemedicine visit and am voluntarily participating in the telemedicine visit.  I understand that I have the right to withhold or withdraw my consent to the use of telemedicine in the course of my care at any time, without affecting my right to future care or treatment, and that the Practitioner or I may terminate the telemedicine visit at any time. I understand that I have the right to  inspect all information obtained and/or recorded in the course of the telemedicine visit and may receive copies of available information for a reasonable fee.  I understand that some of the potential risks of receiving the Services via telemedicine include:   Delay or interruption in medical evaluation due to technological equipment failure or disruption;  Information transmitted may not be sufficient (e.g. poor resolution of images) to allow for appropriate medical decision making by the Practitioner; and/or   In rare instances, security protocols could fail, causing a breach of personal health information.  Furthermore, I acknowledge that it is my responsibility to provide information about my medical history, conditions and care that is complete and accurate to the best of my ability. I acknowledge that Practitioner's advice, recommendations, and/or decision may be based on factors not within their control, such as incomplete or inaccurate data provided by me or distortions of diagnostic images or specimens that may result from electronic transmissions. I understand that the practice of medicine is not an exact science and that Practitioner makes no warranties or guarantees regarding treatment outcomes. I acknowledge that I  will receive a copy of this consent concurrently upon execution via email to the email address I last provided but may also request a printed copy by calling the office of [facility].    I understand that my insurance will be billed for this visit.   I have read or had this consent read to me.  I understand the contents of this consent, which adequately explains the benefits and risks of the Services being provided via telemedicine.   I have been provided ample opportunity to ask questions regarding this consent and the Services and have had my questions answered to my satisfaction.  I give my informed consent for the services to be provided through the use of  telemedicine in my medical care  By participating in this telemedicine visit I agree to the above.

## 2018-11-10 NOTE — Progress Notes (Signed)
Virtual Visit via Video Note   This visit type was conducted due to national recommendations for restrictions regarding the COVID-19 Pandemic (e.g. social distancing) in an effort to limit this patient's exposure and mitigate transmission in our community.  Due to his co-morbid illnesses, this patient is at least at moderate risk for complications without adequate follow up.  This format is felt to be most appropriate for this patient at this time.  All issues noted in this document were discussed and addressed.  A limited physical exam was performed with this format.  Please refer to the patient's chart for his consent to telehealth for [facility].   Evaluation Performed:  Follow-up visit  Date:  11/10/2018   ID:  Frederick Ward, Frederick Ward, MRN [id number]  Patient Location: Home Provider Location: Home  PCP:  Monica Becton, MD  Cardiologist:  No primary care provider on file. Dr Dulce Sellar Electrophysiologist:  None   Chief Complaint:  CAD  History of Present Illness:    Frederick Ward is a 49 y.o. male with a hx of exertional chest pain, mild CAD  and hyperlipidemia last seen  12/24/17  Overall he is done well he remains vigorous active and had one episode with very severe exertion doing heavy gardening work that he had typical exertional angina relieved with nitroglycerin.  He is functionally New York Heart Association class I.  He has had no shortness of breath edema palpitation or syncope.  He tolerates his medications including high intensity statin without muscle symptoms.  His last labs were in May 2019 I will make arrangements for outpatient CMP and lipid profile.  I do not think he needs come to the office for an EKG and I will see him in follow-up in 6 months or sooner if he has a flare of anginal symptoms he will continue his current medical therapy including aspirin beta-blocker and high intensity statin.  The patient does not have symptoms concerning for COVID-19  infection (fever, chills, cough, or new shortness of breath).    Past Medical History:  Diagnosis Date  . Annual physical exam 07/26/2014  . Chest pain 08/25/2017  . History of right peroneus brevis tear post repair 07/26/2014  . Hyperlipidemia 07/26/2014  . Lumbar facet joint syndrome 04/11/2016  . Obstructive uropathy 03/19/2016  . Osteoarthritis of both knees 07/26/2014   Postarthroscopy and partial meniscectomy on the right. Unloader brace on the right. Status post anterior cruciate ligament repair with reconstruction on the left.  Orthovisc approved   . Pain    LEFT SHOULDER- ROTATOR CUFF  TEAR  . Primary osteoarthritis of first carpometacarpal joint of right hand 06/04/2016  . 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 Rotator cuff tear, left 06/18/2012  . S/P ventriculoperitoneal shunt 07/26/2014  . Smoker 08/19/2017  . Stable angina (HCC) 08/19/2017  . 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 Yellow jacket sting allergy    Past Surgical History:  Procedure Laterality Date  . ANTERIOR CRUCIATE LIGAMENT REPAIR  1997 OR 1998   LEFT KNEE  . LEFT ANKLE TENDON TRANSFER  FEB 2010  . RIGHT ANKLE TENDON TRANSFER  SEPT 30, 2013   SURGERY WAS DONE IN [facility]--PT WEARS  ORTHOTIC BOOT WHEN AMBULATING--HEALING INCISION--SOME WEAKNESS STILL  . RIGHT WRIST FIXATION AUG  2006    . SHOULDER ARTHROSCOPY WITH ROTATOR CUFF REPAIR AND SUBACROMIAL  DECOMPRESSION  06/18/2012   Procedure: SHOULDER ARTHROSCOPY WITH ROTATOR CUFF REPAIR AND SUBACROMIAL DECOMPRESSION;  Surgeon: Javier Docker, MD;  Location: [facility];  Service: Orthopedics;  Laterality: Left;  Left Shoulder arthroscopy subacromial decompression mini open rotator cuff repair   . VENTRICULOPERITONEAL SHUNT       No outpatient medications have been marked as taking for the 11/11/18 encounter (Appointment) with Baldo Daub, MD.      Allergies:   Bee venom   Social History   Tobacco Use  . Smoking status: Former Smoker    Packs/day: 1.00    Years: 20.00    Pack years: 20.00    Types: Cigarettes  . Smokeless tobacco: Never Used  . Tobacco comment: QUIT SMOKING 2011, has restarted   Substance Use Topics  . Alcohol use: No  . Drug use: No     Family Hx: The patient's family history includes Arthritis in his father and sister; CAD in his father; Diabetes in his father; Hyperlipidemia in his father.  ROS:   Please see the history of present illness.     All other systems reviewed and are negative.   Prior CV studies:   The following studies were reviewed today:    Labs/Other Tests and Data Reviewed:    EKG:  No ECG reviewed.  Recent Labs: No results found for requested labs within last 8760 hours.   Recent Lipid Panel Lab Results  Component Value Date/Time   CHOL 95 (L) 11/14/2017 10:30 AM   TRIG 92 11/14/2017 10:30 AM   HDL 35 (L) 11/14/2017 10:30 AM   CHOLHDL 2.7 11/14/2017 10:30 AM   CHOLHDL 3.4 08/19/2017 04:29 PM   LDLCALC 42 11/14/2017 10:30 AM   LDLCALC 74 08/19/2017 04:29 PM    Wt Readings from Last 3 Encounters:  07/21/18 176 lb (79.8 kg)  05/22/18 174 lb (78.9 kg)  04/03/18 172 lb 1.9 oz (78.1 kg)     Objective:    Vital Signs:  There were no vitals taken for this visit.   VITAL SIGNS:  reviewed GEN:  no acute distress EYES:  sclerae anicteric, EOMI - Extraocular Movements Intact RESPIRATORY:  normal respiratory effort, symmetric expansion CARDIOVASCULAR:  no peripheral edema SKIN:  no rash, lesions or ulcers. MUSCULOSKELETAL:  no obvious deformities. NEURO:  alert and oriented x 3, no obvious focal deficit PSYCH:  normal affect  ASSESSMENT & PLAN:    1. CAD stable New York Heart Association class I continue current medical therapy aspirin beta-blocker high intensity statin. 2. Hyperlipidemia stable continue high intensity statin he is overdue and needs to have labs  done for safety liver function CMP and efficacy lipid profile with a goal at goal LDL of less than 70 and ideally less than 55.  COVID-19 Education: The signs and symptoms of COVID-19 were discussed with the patient and how to seek care for testing (follow up with PCP or arrange E-visit).  The importance of social distancing was discussed today.  Time:   Today, I have spent 20 minutes with the patient with telehealth technology discussing the above problems.     Medication Adjustments/Labs and Tests Ordered: Current medicines are reviewed at length with the patient today.  Concerns regarding medicines are outlined above.   Tests Ordered: No orders of the defined types were placed in this encounter.   Medication Changes: No orders of the defined types were placed in this encounter.   Disposition:  Follow up in 6 month(s)  Signed, Norman Herrlich,  MD  11/10/2018 8:54 AM    [facility]

## 2018-11-11 ENCOUNTER — Other Ambulatory Visit: Payer: Self-pay

## 2018-11-11 ENCOUNTER — Telehealth (INDEPENDENT_AMBULATORY_CARE_PROVIDER_SITE_OTHER): Payer: BLUE CROSS/BLUE SHIELD | Admitting: Cardiology

## 2018-11-11 ENCOUNTER — Encounter: Payer: Self-pay | Admitting: Cardiology

## 2018-11-11 VITALS — BP 107/72 | Ht 67.0 in | Wt 176.0 lb

## 2018-11-11 DIAGNOSIS — I2510 Atherosclerotic heart disease of native coronary artery without angina pectoris: Secondary | ICD-10-CM | POA: Diagnosis not present

## 2018-11-11 DIAGNOSIS — E785 Hyperlipidemia, unspecified: Secondary | ICD-10-CM

## 2018-11-11 NOTE — Patient Instructions (Addendum)
Medication Instructions:  Your physician recommends that you continue on your current medications as directed. Please refer to the Current Medication list given to you today.  If you need a refill on your cardiac medications before your next appointment, please call your pharmacy.   Lab work: Your physician recommends that you return for lab work within the next week: CMP, lipid panel. Please go to the [facility] in [place] ([address] [place], [place] [address]) for labs, no appointment needed. Please fast beforehand.   If you have labs (blood work) drawn today and your tests are completely normal, you will receive your results only by: Marland Kitchen MyChart Message (if you have MyChart) OR . A paper copy in the mail If you have any lab test that is abnormal or we need to change your treatment, we will call you to review the results.  Testing/Procedures: None  Follow-Up: At [facility], you and your health needs are our priority.  As part of our continuing mission to provide you with exceptional heart care, we have created designated Provider Care Teams.  These Care Teams include your primary Cardiologist (physician) and Advanced Practice Providers (APPs -  Physician Assistants and Nurse Practitioners) who all work together to provide you with the care you need, when you need it. You will need a follow up appointment in 6 months: Wednesday, 05/12/2019, at 9:20 am in the [facility] office. Please arrive at 9:05 am.

## 2018-11-11 NOTE — Addendum Note (Signed)
Addended by: Crist Fat on: 11/11/2018 10:30 AM   Modules accepted: Orders

## 2018-11-23 DIAGNOSIS — E785 Hyperlipidemia, unspecified: Secondary | ICD-10-CM | POA: Diagnosis not present

## 2018-11-23 DIAGNOSIS — I2510 Atherosclerotic heart disease of native coronary artery without angina pectoris: Secondary | ICD-10-CM | POA: Diagnosis not present

## 2018-11-24 LAB — LIPID PANEL
Chol/HDL Ratio: 2.4 ratio (ref 0.0–5.0)
Cholesterol, Total: 105 mg/dL (ref 100–199)
HDL: 43 mg/dL (ref >39)
LDL Calculated: 40 mg/dL (ref 0–99)
Triglycerides: 112 mg/dL (ref 0–149)
VLDL Cholesterol Cal: 22 mg/dL (ref 5–40)

## 2018-11-24 LAB — COMPREHENSIVE METABOLIC PANEL
ALT: 54 IU/L — ABNORMAL HIGH (ref 0–44)
AST: 41 IU/L — ABNORMAL HIGH (ref 0–40)
Albumin/Globulin Ratio: 2.4 — ABNORMAL HIGH (ref 1.2–2.2)
Albumin: 4.5 g/dL (ref 4.0–5.0)
Alkaline Phosphatase: 53 IU/L (ref 39–117)
BUN/Creatinine Ratio: 17 (ref 9–20)
BUN: 16 mg/dL (ref 6–24)
Bilirubin Total: 0.6 mg/dL (ref 0.0–1.2)
CO2: 25 mmol/L (ref 20–29)
Calcium: 9.6 mg/dL (ref 8.7–10.2)
Chloride: 105 mmol/L (ref 96–106)
Creatinine, Ser: 0.93 mg/dL (ref 0.76–1.27)
GFR calc Af Amer: 113 mL/min/{1.73_m2} (ref >59)
GFR calc non Af Amer: 97 mL/min/{1.73_m2} (ref >59)
Globulin, Total: 1.9 g/dL (ref 1.5–4.5)
Glucose: 94 mg/dL (ref 65–99)
Potassium: 4.8 mmol/L (ref 3.5–5.2)
Sodium: 142 mmol/L (ref 134–144)
Total Protein: 6.4 g/dL (ref 6.0–8.5)

## 2018-11-26 ENCOUNTER — Other Ambulatory Visit: Payer: Self-pay | Admitting: *Deleted

## 2018-11-26 ENCOUNTER — Telehealth: Payer: Self-pay | Admitting: *Deleted

## 2018-11-26 DIAGNOSIS — E785 Hyperlipidemia, unspecified: Secondary | ICD-10-CM

## 2018-11-26 MED ORDER — ROSUVASTATIN CALCIUM 10 MG PO TABS: 10.0000 mg | ORAL_TABLET | Freq: Every day | ORAL | 1 refills | Status: DC

## 2018-11-26 NOTE — Telephone Encounter (Signed)
-----   Message from Garwin Brothers, MD sent at 11/24/2018  9:11 AM EDT ----- His liver tests are elevated he needs to reduce statin by 50% of the dose.  In other words take half the statin dose and recheck liver panel in 1 month.  Please also run this by Dr. Dulce Sellar when he is back. Garwin Brothers, MD 11/24/2018 9:10 AM

## 2018-11-26 NOTE — Telephone Encounter (Signed)
Patient called back. Informed of lab results especially the liver enzymes being elevated. Informed him to decrease his rosuvatstatin to 10 mg daily and recheck liver in 55month. Patient verbalized understanding.

## 2018-11-26 NOTE — Telephone Encounter (Signed)
-----   Message from Rajan R Revankar, MD sent at 11/24/2018  9:11 AM EDT ----- His liver tests are elevated he needs to reduce statin by 50% of the dose.  In other words take half the statin dose and recheck liver panel in 1 month.  Please also run this by Dr. Munley when he is back. Rajan R Revankar, MD 11/24/2018 9:10 AM 

## 2018-11-26 NOTE — Telephone Encounter (Signed)
Left message to call back in regards to his lab results and rosuvastatin dose.

## 2019-01-14 ENCOUNTER — Other Ambulatory Visit: Payer: Self-pay | Admitting: Sports Medicine

## 2019-01-14 DIAGNOSIS — I208 Other forms of angina pectoris: Secondary | ICD-10-CM

## 2019-02-09 ENCOUNTER — Other Ambulatory Visit: Payer: Self-pay | Admitting: Cardiology

## 2019-02-09 DIAGNOSIS — E785 Hyperlipidemia, unspecified: Secondary | ICD-10-CM

## 2019-02-09 DIAGNOSIS — I25118 Atherosclerotic heart disease of native coronary artery with other forms of angina pectoris: Secondary | ICD-10-CM

## 2019-02-26 ENCOUNTER — Other Ambulatory Visit: Payer: Self-pay | Admitting: Sports Medicine

## 2019-02-26 ENCOUNTER — Encounter: Payer: Self-pay | Admitting: Sports Medicine

## 2019-02-26 DIAGNOSIS — M47816 Spondylosis without myelopathy or radiculopathy, lumbar region: Secondary | ICD-10-CM

## 2019-02-26 MED ORDER — OXYCODONE HCL 10 MG PO TABS: 10.0000 mg | ORAL_TABLET | Freq: Three times a day (TID) | ORAL | 0 refills | Status: DC | PRN

## 2019-02-28 ENCOUNTER — Other Ambulatory Visit: Payer: Self-pay | Admitting: Sports Medicine

## 2019-02-28 DIAGNOSIS — F17200 Nicotine dependence, unspecified, uncomplicated: Secondary | ICD-10-CM

## 2019-03-01 ENCOUNTER — Other Ambulatory Visit: Payer: Self-pay

## 2019-03-01 ENCOUNTER — Encounter: Payer: Self-pay | Admitting: Sports Medicine

## 2019-03-01 ENCOUNTER — Ambulatory Visit (INDEPENDENT_AMBULATORY_CARE_PROVIDER_SITE_OTHER): Payer: BC Managed Care – PPO

## 2019-03-01 ENCOUNTER — Ambulatory Visit: Payer: BLUE CROSS/BLUE SHIELD | Admitting: Sports Medicine

## 2019-03-01 DIAGNOSIS — M79671 Pain in right foot: Secondary | ICD-10-CM | POA: Diagnosis not present

## 2019-03-01 DIAGNOSIS — M7541 Impingement syndrome of right shoulder: Secondary | ICD-10-CM

## 2019-03-01 DIAGNOSIS — Q6671 Congenital pes cavus, right foot: Secondary | ICD-10-CM | POA: Insufficient documentation

## 2019-03-01 DIAGNOSIS — N139 Obstructive and reflux uropathy, unspecified: Secondary | ICD-10-CM | POA: Diagnosis not present

## 2019-03-01 DIAGNOSIS — Q6672 Congenital pes cavus, left foot: Secondary | ICD-10-CM | POA: Insufficient documentation

## 2019-03-01 DIAGNOSIS — M19011 Primary osteoarthritis, right shoulder: Secondary | ICD-10-CM | POA: Diagnosis not present

## 2019-03-01 HISTORY — DX: Impingement syndrome of right shoulder: M75.41

## 2019-03-01 HISTORY — DX: Pain in right foot: M79.671

## 2019-03-01 IMAGING — DX RIGHT FOOT COMPLETE - 3+ VIEW
3 series · 3 of 3 positions shown · non-contrast
Comparison: [DATE].

CLINICAL DATA: Pain

EXAM:
RIGHT FOOT COMPLETE - 3+ VIEW

[foot ap]
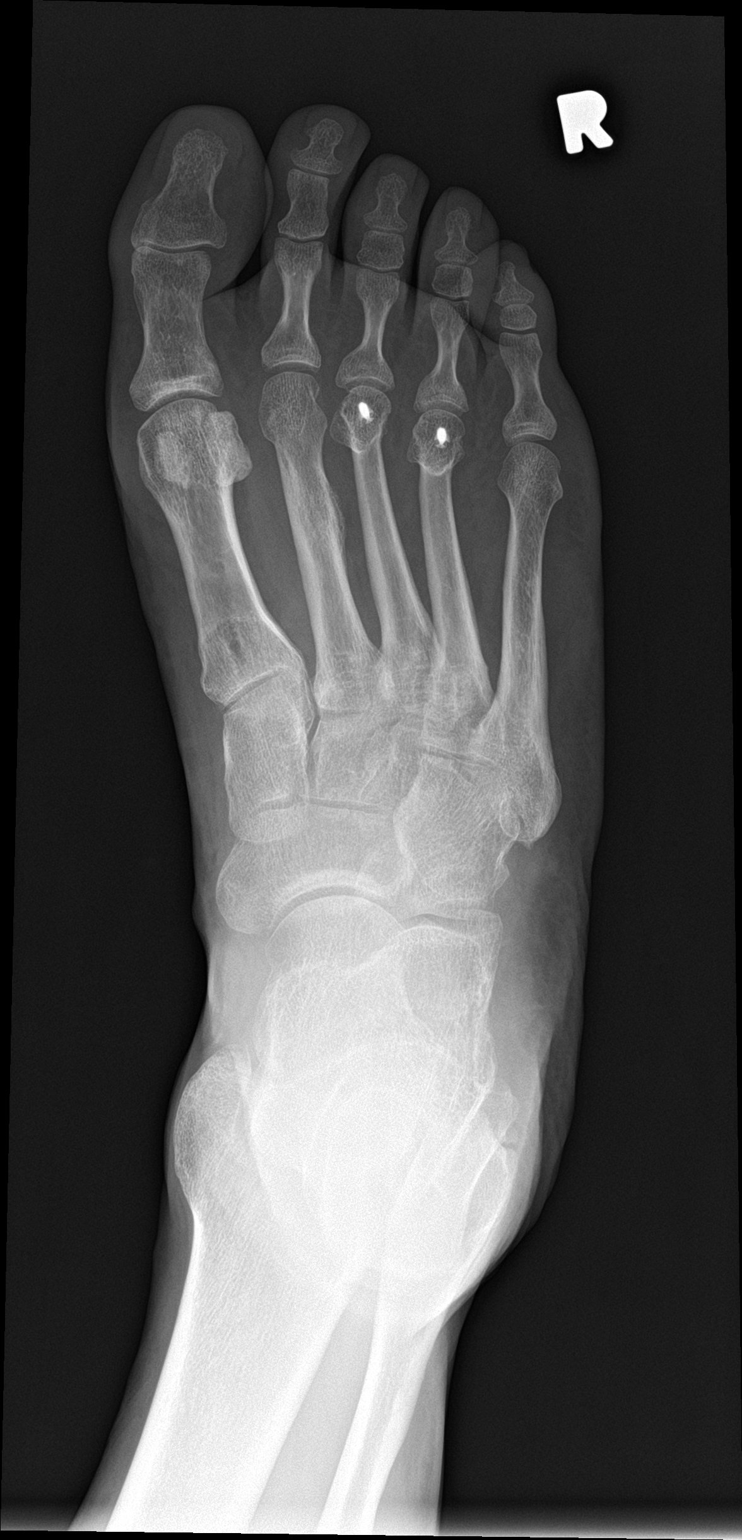

[foot obl]
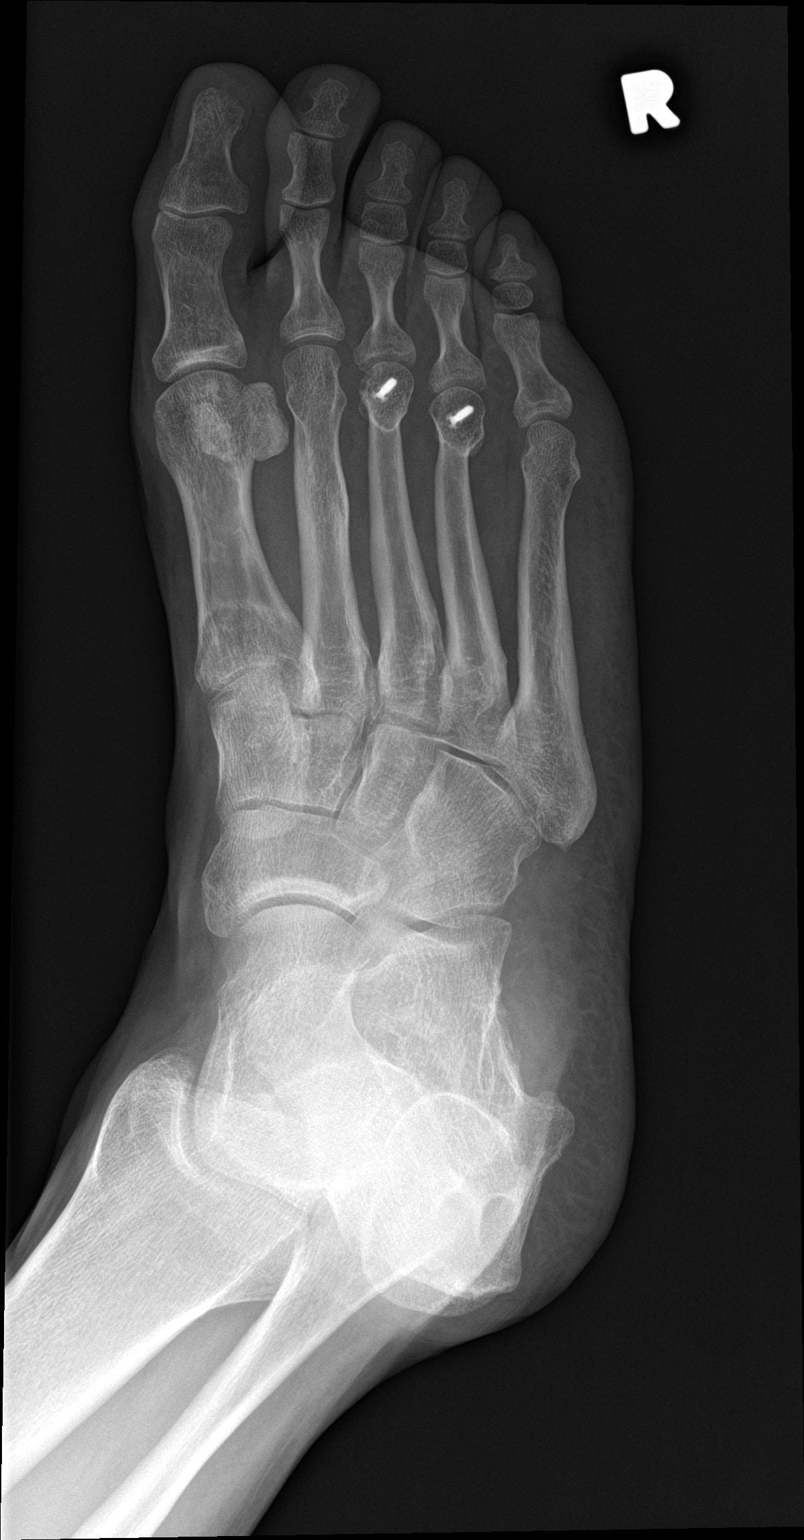

[foot lat]
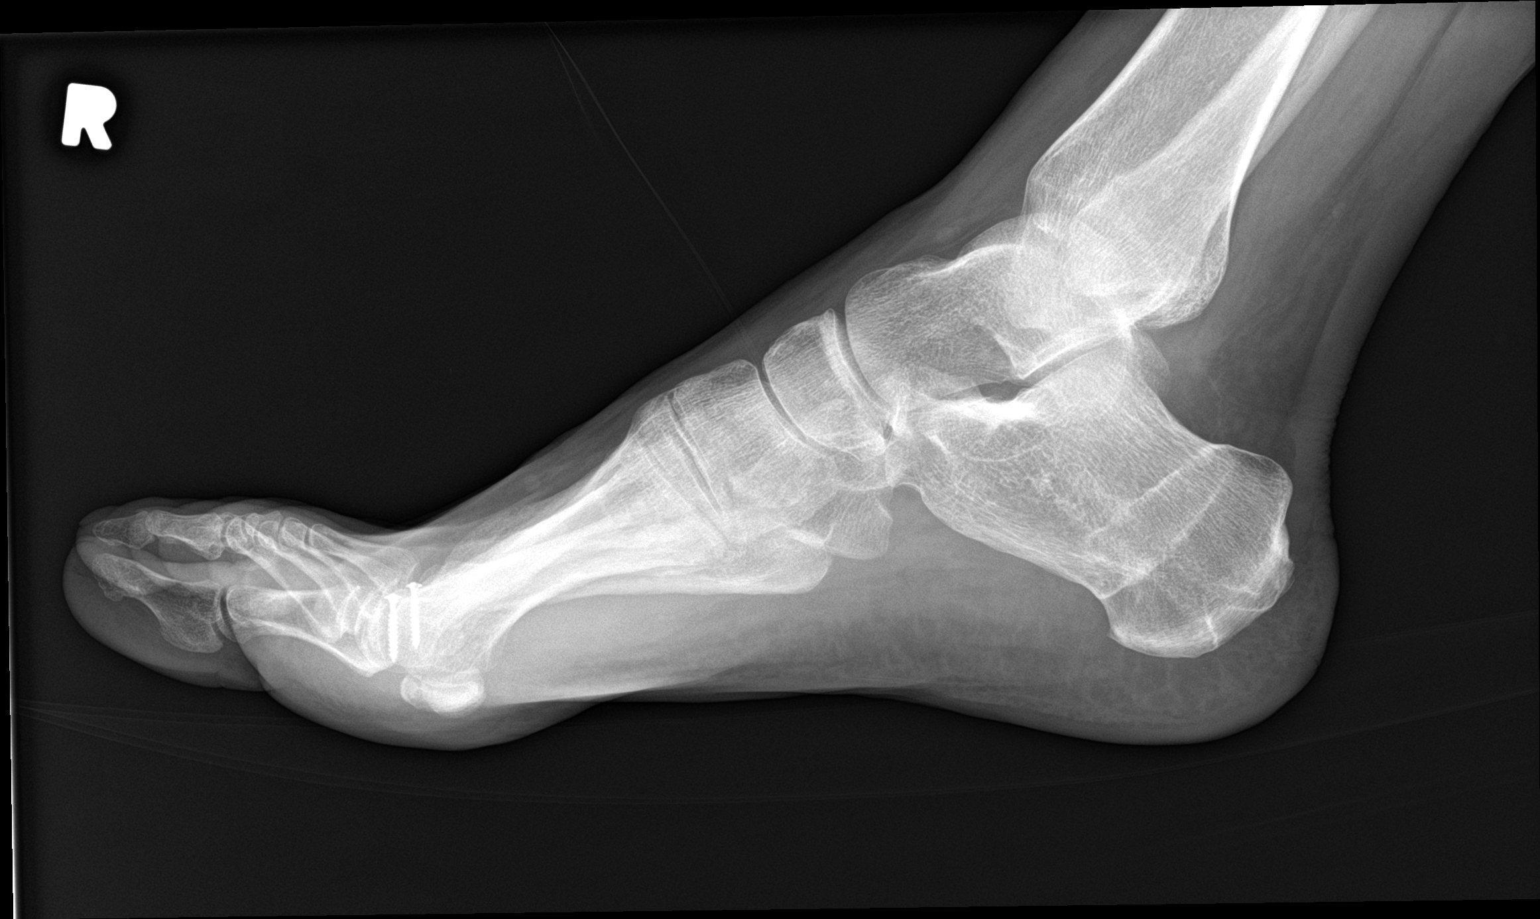

[3 of 3 positions shown; findings below may reference images not displayed]

FINDINGS: Frontal, oblique, and lateral views obtained. There is postoperative
change in the distal second and third metatarsals. There is an old
fracture of the midportion of the second metatarsal with much less
periosteal thickening in this area compared to previous study.

No evident acute fracture or dislocation. Joint spaces appear
unremarkable. No erosive change. There is evidence of old
postoperative change in the calcaneus with remodeling. Previous
screws have been removed from this area. There is also evidence of
previous surgery with remodeling in the proximal first metatarsal.
IMPRESSION: Areas of prior postoperative change. Old fracture second metatarsal
with remodeling. There is significantly less periosteal reaction in
this area compared to previous study. No acute fracture or
dislocation. No appreciable joint space narrowing or erosion.

## 2019-03-01 NOTE — Progress Notes (Signed)
Subjective:    CC: Multiple issues  HPI: Obstructive uropathy: Historically controlled with Flomax and Avodart, now having increasing nocturia, urgency, hesitancy, dribbling.  Shoulder pain: Right-sided, localized over the deltoid and worse with overhead activities and throwing.  Foot pain: Present for greater than 6 weeks, pain is localized over the dorsal lateral midfoot, moderate gelling, worse in the morning and with the first few steps.  No trauma.  I reviewed the past medical history, family history, social history, surgical history, and allergies today and no changes were needed.  Please see the problem list section below in epic for further details.  Past Medical History: Past Medical History:  Diagnosis Date  . Annual physical exam 07/26/2014  . Chest pain 08/25/2017  . History of right peroneus brevis tear post repair 07/26/2014  . Hyperlipidemia 07/26/2014  . Lumbar facet joint syndrome 04/11/2016  . Obstructive uropathy 03/19/2016  . Osteoarthritis of both knees 07/26/2014   Postarthroscopy and partial meniscectomy on the right. Unloader brace on the right. Status post anterior cruciate ligament repair with reconstruction on the left.  Orthovisc approved   . Pain    LEFT SHOULDER- ROTATOR CUFF  TEAR  . Primary osteoarthritis of first carpometacarpal joint of right hand 06/04/2016  . 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 Rotator cuff tear, left 06/18/2012  . S/P ventriculoperitoneal shunt 07/26/2014  . Smoker 08/19/2017  . Stable angina (HCC) 08/19/2017  . 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 Yellow jacket sting allergy    Past Surgical History: Past Surgical History:  Procedure Laterality Date  . ANTERIOR CRUCIATE LIGAMENT REPAIR  1997 OR 1998   LEFT KNEE  . LEFT ANKLE TENDON TRANSFER  FEB 2010  . RIGHT ANKLE TENDON TRANSFER  SEPT 30, 2013   SURGERY WAS DONE IN [facility] [facility]--PT WEARS  ORTHOTIC BOOT WHEN AMBULATING--HEALING INCISION--SOME WEAKNESS STILL  . RIGHT WRIST FIXATION AUG  2006    . SHOULDER ARTHROSCOPY WITH ROTATOR CUFF REPAIR AND SUBACROMIAL DECOMPRESSION  06/18/2012   Procedure: SHOULDER ARTHROSCOPY WITH ROTATOR CUFF REPAIR AND SUBACROMIAL DECOMPRESSION;  Surgeon: Javier DockerJeffrey C Beane, MD;  Location: [facility];  Service: Orthopedics;  Laterality: Left;  Left Shoulder arthroscopy subacromial decompression mini open rotator cuff repair   . VENTRICULOPERITONEAL SHUNT     Social History: Social History   Socioeconomic History  . Marital status: Married    Spouse name: Not on file  . Number of children: Not on file  . Years of education: Not on file  . Highest education level: Not on file  Occupational History  . Not on file  Social Needs  . Financial resource strain: Not on file  . Food insecurity    Worry: Not on file    Inability: Not on file  . Transportation needs    Medical: Not on file    Non-medical: Not on file  Tobacco Use  . Smoking status: Former Smoker    Packs/day: 1.00    Years: 20.00    Pack years: 20.00    Types: Cigarettes  . Smokeless tobacco: Never Used  . Tobacco comment: QUIT SMOKING 2011, has restarted   Substance and Sexual Activity  . Alcohol use: No  . Drug use: No  . Sexual activity: Not on file  Lifestyle  . Physical activity    Days per week: Not on file    Minutes per session:  Not on file  . Stress: Not on file  Relationships  . Social Herbalist on phone: Not on file    Gets together: Not on file    Attends religious service: Not on file    Active member of club or organization: Not on file    Attends meetings of clubs or organizations: Not on file    Relationship status: Not on file  Other Topics Concern  . Not on file  Social History Narrative  . Not on file   Family History: Family History  Problem Relation Age of Onset  . Diabetes Father   . CAD Father   .  Hyperlipidemia Father   . Arthritis Father   . Arthritis Sister    Allergies: Allergies  Allergen Reactions  . Bee Venom Other (See Comments)    Numbness and syncope *fell x2 and muscles wouldn't work well, 21 Mar 2005   Medications: See med rec.  Review of Systems: No fevers, chills, night sweats, weight loss, chest pain, or shortness of breath.   Objective:    General: Well Developed, well nourished, and in no acute distress.  Neuro: Alert and oriented x3, extra-ocular muscles intact, sensation grossly intact.  HEENT: Normocephalic, atraumatic, pupils equal round reactive to light, neck supple, no masses, no lymphadenopathy, thyroid nonpalpable.  Skin: Warm and dry, no rashes. Cardiac: Regular rate and rhythm, no murmurs rubs or gallops, no lower extremity edema.  Respiratory: Clear to auscultation bilaterally. Not using accessory muscles, speaking in full sentences. Right shoulder: Inspection reveals no abnormalities, atrophy or asymmetry. Palpation is normal with no tenderness over AC joint or bicipital groove. ROM is full in all planes. Rotator cuff strength normal throughout. Positive Neer and Hawkin's tests, empty can. Speeds and Yergason's tests normal. No labral pathology noted with negative Obrien's, negative crank, negative clunk, and good stability. Normal scapular function observed. No painful arc and no drop arm sign. No apprehension sign Right foot: No visible erythema or swelling. Range of motion is full in all directions. Strength is 5/5 in all directions. No hallux valgus. No pes cavus or pes planus. No abnormal callus noted. No pain over the navicular prominence, or base of fifth metatarsal. No tenderness to palpation of the calcaneal insertion of plantar fascia. No pain at the Achilles insertion. No pain over the calcaneal bursa. No pain of the retrocalcaneal bursa. No tenderness to palpation over the tarsals, metatarsals, or phalanges. No hallux  rigidus or limitus. Tender over the dorsal lateral midfoot in the intertarsal joints. No pain with compression of the metatarsal heads. Neurovascularly intact distally.  Impression and Recommendations:    Obstructive uropathy Historically has done well with Avodart and Flomax though he noted his sex drive was decreased with Avodart. At this point he is now noting waning efficacy of both, with significant urinary hesitancy, weak stream, dribbling, and nocturia. I would like urology to weigh in.  Right foot pain Pain at the right midfoot, suspected degenerative changes. Stress fracture is in the differential as well. He has some custom orthotics already, I added a lateral wedge which he told me has helped him in the past. Adding x-rays, MRI considering greater than 6 weeks of pain in spite of physician directed conservative measures including orthotics by another provider.   Impingement syndrome of right shoulder Worse throwing a baseball with his son. Adding rotator cuff rehabilitation exercises, x-rays, return in 6 weeks for this.   ___________________________________________ Gwen Her. Dianah Field, M.D., ABFM., CAQSM. Primary  Care and [employer] Professor of [employer] [facility]

## 2019-03-01 NOTE — Assessment & Plan Note (Signed)
Historically has done well with Avodart and Flomax though he noted his sex drive was decreased with Avodart. At this point he is now noting waning efficacy of both, with significant urinary hesitancy, weak stream, dribbling, and nocturia. I would like urology to weigh in.

## 2019-03-01 NOTE — Assessment & Plan Note (Signed)
Worse throwing a baseball with his son. Adding rotator cuff rehabilitation exercises, x-rays, return in 6 weeks for this.

## 2019-03-01 NOTE — Assessment & Plan Note (Addendum)
Pain at the right midfoot, suspected degenerative changes. Stress fracture is in the differential as well. He has some custom orthotics already, I added a lateral wedge which he told me has helped him in the past. Adding x-rays, MRI considering greater than 6 weeks of pain in spite of physician directed conservative measures including orthotics by another provider.

## 2019-03-25 ENCOUNTER — Other Ambulatory Visit: Payer: Self-pay | Admitting: Sports Medicine

## 2019-03-29 ENCOUNTER — Other Ambulatory Visit: Payer: Self-pay | Admitting: Cardiology

## 2019-03-29 ENCOUNTER — Other Ambulatory Visit: Payer: Self-pay | Admitting: Sports Medicine

## 2019-03-29 DIAGNOSIS — E785 Hyperlipidemia, unspecified: Secondary | ICD-10-CM

## 2019-03-29 DIAGNOSIS — I25118 Atherosclerotic heart disease of native coronary artery with other forms of angina pectoris: Secondary | ICD-10-CM

## 2019-03-30 ENCOUNTER — Other Ambulatory Visit: Payer: Self-pay | Admitting: Sports Medicine

## 2019-03-30 DIAGNOSIS — I208 Other forms of angina pectoris: Secondary | ICD-10-CM

## 2019-04-01 DIAGNOSIS — R350 Frequency of micturition: Secondary | ICD-10-CM | POA: Diagnosis not present

## 2019-04-01 DIAGNOSIS — R3915 Urgency of urination: Secondary | ICD-10-CM | POA: Diagnosis not present

## 2019-04-01 DIAGNOSIS — N401 Enlarged prostate with lower urinary tract symptoms: Secondary | ICD-10-CM | POA: Diagnosis not present

## 2019-04-01 DIAGNOSIS — R351 Nocturia: Secondary | ICD-10-CM | POA: Diagnosis not present

## 2019-04-12 ENCOUNTER — Encounter: Payer: Self-pay | Admitting: Sports Medicine

## 2019-04-12 ENCOUNTER — Other Ambulatory Visit: Payer: Self-pay

## 2019-04-12 ENCOUNTER — Ambulatory Visit (INDEPENDENT_AMBULATORY_CARE_PROVIDER_SITE_OTHER): Payer: BC Managed Care – PPO | Admitting: Sports Medicine

## 2019-04-12 VITALS — BP 119/67 | HR 70 | Ht 67.0 in | Wt 177.0 lb

## 2019-04-12 DIAGNOSIS — Z23 Encounter for immunization: Secondary | ICD-10-CM

## 2019-04-12 DIAGNOSIS — M1811 Unilateral primary osteoarthritis of first carpometacarpal joint, right hand: Secondary | ICD-10-CM | POA: Diagnosis not present

## 2019-04-12 DIAGNOSIS — N139 Obstructive and reflux uropathy, unspecified: Secondary | ICD-10-CM | POA: Diagnosis not present

## 2019-04-12 DIAGNOSIS — M79671 Pain in right foot: Secondary | ICD-10-CM | POA: Diagnosis not present

## 2019-04-12 NOTE — Assessment & Plan Note (Signed)
Off of both medications, saw the urologist, they are enrolling him in a clinical trial for a new medication.

## 2019-04-12 NOTE — Assessment & Plan Note (Signed)
Pain is minimal, x-ray showed some postoperative changes, no further evaluation needed.

## 2019-04-12 NOTE — Assessment & Plan Note (Signed)
Status post right thumb suspension. His left thumb is starting to bother him, he does not desire any injections and would like to proceed straight to surgery, not yet ready, he will let us know.

## 2019-04-12 NOTE — Progress Notes (Signed)
Subjective:    CC: Follow-up  HPI: Obstructive uropathy: Has seen urology, currently off of medications and in a clinical trial.  CMC arthritis: Post right-sided thumb suspension surgery, left side is starting to bother him.    Right foot pain: Symptoms resolved.  I reviewed the past medical history, family history, social history, surgical history, and allergies today and no changes were needed.  Please see the problem list section below in epic for further details.  Past Medical History: Past Medical History:  Diagnosis Date  . Annual physical exam 07/26/2014  . Chest pain 08/25/2017  . History of right peroneus brevis tear post repair 07/26/2014  . Hyperlipidemia 07/26/2014  . Lumbar facet joint syndrome 04/11/2016  . Obstructive uropathy 03/19/2016  . Osteoarthritis of both knees 07/26/2014   Postarthroscopy and partial meniscectomy on the right. Unloader brace on the right. Status post anterior cruciate ligament repair with reconstruction on the left.  Orthovisc approved   . Pain    LEFT SHOULDER- ROTATOR CUFF  TEAR  . Primary osteoarthritis of first carpometacarpal joint of right hand 06/04/2016  . 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 Rotator cuff tear, left 06/18/2012  . S/P ventriculoperitoneal shunt 07/26/2014  . Smoker 08/19/2017  . Stable angina (HCC) 08/19/2017  . 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 Yellow jacket sting allergy    Past Surgical History: Past Surgical History:  Procedure Laterality Date  . ANTERIOR CRUCIATE LIGAMENT REPAIR  1997 OR 1998   LEFT KNEE  . LEFT ANKLE TENDON TRANSFER  FEB 2010  . RIGHT ANKLE TENDON TRANSFER  SEPT 30, 2013   SURGERY [facility] DONE IN [facility]--PT WEARS  ORTHOTIC BOOT WHEN AMBULATING--HEALING INCISION--SOME WEAKNESS STILL  . RIGHT WRIST FIXATION AUG  2006    . SHOULDER ARTHROSCOPY WITH ROTATOR CUFF REPAIR AND  SUBACROMIAL DECOMPRESSION  06/18/2012   Procedure: SHOULDER ARTHROSCOPY WITH ROTATOR CUFF REPAIR AND SUBACROMIAL DECOMPRESSION;  Surgeon: Javier Docker, MD;  Location: [facility];  Service: Orthopedics;  Laterality: Left;  Left Shoulder arthroscopy subacromial decompression mini open rotator cuff repair   . VENTRICULOPERITONEAL SHUNT     Social History: Social History   Socioeconomic History  . Marital status: Married    Spouse name: Not on file  . Number of children: Not on file  . Years of education: Not on file  . Highest education level: Not on file  Occupational History  . Not on file  Social Needs  . Financial resource strain: Not on file  . Food insecurity    Worry: Not on file    Inability: Not on file  . Transportation needs    Medical: Not on file    Non-medical: Not on file  Tobacco Use  . Smoking status: Former Smoker    Packs/day: 1.00    Years: 20.00    Pack years: 20.00    Types: Cigarettes  . Smokeless tobacco: Never Used  . Tobacco comment: QUIT SMOKING 2011, has restarted   Substance and Sexual Activity  . Alcohol use: No  . Drug use: No  . Sexual activity: Not on file  Lifestyle  . Physical activity    Days per week: Not on file    Minutes per session: Not on file  . Stress: Not on file  Relationships  . Social connections    Talks on phone: Not on file  Gets together: Not on file    Attends religious service: Not on file    Active member of club or organization: Not on file    Attends meetings of clubs or organizations: Not on file    Relationship status: Not on file  Other Topics Concern  . Not on file  Social History Narrative  . Not on file   Family History: Family History  Problem Relation Age of Onset  . Diabetes Father   . CAD Father   . Hyperlipidemia Father   . Arthritis Father   . Arthritis Sister    Allergies: Allergies  Allergen Reactions  . Bee Venom Other (See Comments)    Numbness and syncope *fell x2 and muscles  wouldn't work well, 21 Mar 2005   Medications: See med rec.  Review of Systems: No fevers, chills, night sweats, weight loss, chest pain, or shortness of breath.   Objective:    General: Well Developed, well nourished, and in no acute distress.  Neuro: Alert and oriented x3, extra-ocular muscles intact, sensation grossly intact.  HEENT: Normocephalic, atraumatic, pupils equal round reactive to light, neck supple, no masses, no lymphadenopathy, thyroid nonpalpable.  Skin: Warm and dry, no rashes. Cardiac: Regular rate and rhythm, no murmurs rubs or gallops, no lower extremity edema.  Respiratory: Clear to auscultation bilaterally. Not using accessory muscles, speaking in full sentences.  Impression and Recommendations:    Primary osteoarthritis of first carpometacarpal joint of right hand Status post right thumb suspension. His left thumb is starting to bother him, he does not desire any injections and would like to proceed straight to surgery, not yet ready, he will let us know.  Obstructive uropathy Off of both medications, saw the urologist, they are enrolling him in a clinical trial for a new medication.  Right foot pain Pain is minimal, x-ray showed some postoperative changes, no further evaluation needed.   ___________________________________________ Gwen Her. Dianah Field, M.D., ABFM., CAQSM. Primary Care and Sports Medicine [facility]  Adjunct Professor of [employer]

## 2019-04-26 ENCOUNTER — Other Ambulatory Visit: Payer: Self-pay | Admitting: Sports Medicine

## 2019-05-09 NOTE — Progress Notes (Signed)
Virtual Visit via Video Note   This visit type was conducted due to national recommendations for restrictions regarding the COVID-19 Pandemic (e.g. social distancing) in an effort to limit this patient's exposure and mitigate transmission in our community.  Due to his co-morbid illnesses, this patient is at least at moderate risk for complications without adequate follow up.  This format is felt to be most appropriate for this patient at this time.  All issues noted in this document were discussed and addressed.  A limited physical exam was performed with this format.  Please refer to the patient's chart for his consent to telehealth for Frederick Ward. Date:  05/12/2019   ID:  Frederick Ward, DOB 1970/10/28, MRN [id number]  PCP:  Frederick Becton, MD  Cardiologist:  Frederick Herrlich, MD    Referring MD: Frederick Ward,*   Greater than 15 minutes was spent with the patient regarding heart disease medication lifestyle changes discussion of his abnormal liver function test and formulating plan for lipid-lowering therapy and rechecking liver function test. ASSESSMENT:    1. Mild CAD   2. Hyperlipidemia LDL goal <70   3. Abnormal liver function tests    PLAN:    In order of problems listed above:  1. Stable CAD is vigorous active has had no anginal discomfort New York Heart Association class I and will continue medical therapy including low-dose aspirin beta-blocker and intensive lipid-lowering therapy with high intensity statin and Zetia. 2. Hyperlipidemia stable I would not have decreased his statin dosage I asked him to increase it again he had minimal nonspecific LFT abnormality we will go ahead tomorrow and recheck lipid profile CMP with liver function. 3. Minimal nonspecific liver function abnormality I would not of decreased a statin 4. I encouraged him that he continues to abstain from cigarette smoking   Next appointment: 6 months   Medication Adjustments/Labs and  Tests Ordered: Current medicines are reviewed at length with the patient today.  Concerns regarding medicines are outlined above.  No orders of the defined types were placed in this encounter.  No orders of the defined types were placed in this encounter.   Chief Complaint  Patient presents with  . Follow-up  . Coronary Artery Disease  . Hyperlipidemia    History of Present Illness:    Frederick Ward is a 48 y.o. male with a hx of CAD and dyslipidemia last seen 11/11/2018. Compliance with diet, lifestyle and medications: Yes  In May he had very mild cold nonspecific elevation of transaminases in the range of 1-2 times normal however 1 my partners reduce the statin dose by 50%.  He is under stress being out of work right now but remains vigorous active exercises not smoking using Chantix and has had no angina dyspnea palpitation or syncope tolerates a statin and has no live history of liver disease.  Cardiac CTA:  08/25/2017 FINDINGS: Non-cardiac: See separate report from [facility]. No significant findings on limited lung and soft tissue windows.  Calcium Score: 3 vessel coronary artery calcium noted  Coronary Arteries: Right dominant with no anomalies LM: Short segment normal LAD: Less than 30% mixed plaque proximally. Less than 50% calcific plaque in mid vessel D1: Less than 50% calcific plaque in proximal vessel D2: Normal Circumflex: Less than 30% calcific plaque in mid vessel OM1: Normal RCA: Less than 50% calcific plaque in proximal mid and distal vessel PDA: Normal PLA: 50% or less calcific plaque IMPRESSION: 1. Calcium score 274  noted in all 3 major epicardial vessels. This is 67 th percentile for age and sex 2. Advanced CAD for age see description above but appears to be non obstructive Study sent for FFR CT 3. FFR RCA:.95 PLB.19              LAD:.90 Distal LAD.83, First Diagonal.87             Circumflex:.94 distal vessel.75  Past Medical History:   Diagnosis Date  . Annual physical exam 07/26/2014  . Chest pain 08/25/2017  . History of right peroneus brevis tear post repair 07/26/2014  . Hyperlipidemia 07/26/2014  . Lumbar facet joint syndrome 04/11/2016  . Obstructive uropathy 03/19/2016  . Osteoarthritis of both knees 07/26/2014   Postarthroscopy and partial meniscectomy on the right. Unloader brace on the right. Status post anterior cruciate ligament repair with reconstruction on the left.  Orthovisc approved   . Pain    LEFT SHOULDER- ROTATOR CUFF  TEAR  . Primary osteoarthritis of first carpometacarpal joint of right hand 06/04/2016  . 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 Rotator cuff tear, left 06/18/2012  . S/P ventriculoperitoneal shunt 07/26/2014  . Smoker 08/19/2017  . Stable angina ([place]) 08/19/2017  . 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 Yellow jacket sting allergy     Past Surgical History:  Procedure Laterality Date  . ANTERIOR CRUCIATE LIGAMENT REPAIR  1997 OR 1998   LEFT KNEE  . LEFT ANKLE TENDON TRANSFER  FEB 2010  . RIGHT ANKLE TENDON TRANSFER  SEPT 30, 2013   SURGERY WAS DONE IN [facility]--PT WEARS  ORTHOTIC BOOT WHEN AMBULATING--HEALING INCISION--SOME WEAKNESS STILL  . RIGHT WRIST FIXATION AUG  2006    . SHOULDER ARTHROSCOPY WITH ROTATOR CUFF REPAIR AND SUBACROMIAL DECOMPRESSION  06/18/2012   Procedure: SHOULDER ARTHROSCOPY WITH ROTATOR CUFF REPAIR AND SUBACROMIAL DECOMPRESSION;  Surgeon: Frederick Hai, MD;  Location: [facility];  Service: Orthopedics;  Laterality: Left;  Left Shoulder arthroscopy subacromial decompression mini open rotator cuff repair   . VENTRICULOPERITONEAL SHUNT      Current Medications: Current Meds  Medication Sig  . ASPIRIN ADULT LOW STRENGTH 81 MG EC tablet TAKE 1 TABLET BY MOUTH EVERY DAY  . CHANTIX 1 MG tablet TAKE 1 TABLET BY MOUTH TWICE A DAY  . EPINEPHrine 0.3  mg/0.3 mL IJ SOAJ injection   . ezetimibe (ZETIA) 10 MG tablet TAKE 1 TABLET BY MOUTH DAILY GENERIC EQUIVALENT FOR ZETIA  . fish oil-omega-3 fatty acids 1000 MG capsule Take 1 g by mouth daily.  . meloxicam (MOBIC) 15 MG tablet TAKE 1 TABLET BY MOUTH EVERY MORNING WITH BREAKFAST FOR 2 WEEKS AND THEN DAILY AS NEEDED FOR PAIN  . metoprolol succinate (TOPROL-XL) 50 MG 24 hr tablet TAKE 1 TABLET BY MOUTH DAILY. TAKE WITH OR IMMEDIATELY FOLLOWING A MEAL  . Multiple Vitamin (MULTIVITAMIN WITH MINERALS) TABS Take 1 tablet by mouth daily.  . nitroGLYCERIN (NITROSTAT) 0.4 MG SL tablet Place 1 tablet (0.4 mg total) under the tongue every 5 (five) minutes as needed for chest pain (or tightness).  . rosuvastatin (CRESTOR) 10 MG tablet Take 1 tablet (10 mg total) by mouth daily.  . tamsulosin (FLOMAX) 0.4 MG CAPS capsule Take 0.8 mg by mouth daily.     Allergies:   Bee venom   Social History   Socioeconomic History  . Marital status: Married  Spouse name: Not on file  . Number of children: Not on file  . Years of education: Not on file  . Highest education level: Not on file  Occupational History  . Not on file  Social Needs  . Financial resource strain: Not on file  . Food insecurity    Worry: Not on file    Inability: Not on file  . Transportation needs    Medical: Not on file    Non-medical: Not on file  Tobacco Use  . Smoking status: Former Smoker    Packs/day: 1.00    Years: 20.00    Pack years: 20.00    Types: Cigarettes  . Smokeless tobacco: Never Used  . Tobacco comment: QUIT SMOKING 2011, has restarted   Substance and Sexual Activity  . Alcohol use: No  . Drug use: No  . Sexual activity: Not on file  Lifestyle  . Physical activity    Days per week: Not on file    Minutes per session: Not on file  . Stress: Not on file  Relationships  . Social Musicianconnections    Talks on phone: Not on file    Gets together: Not on file    Attends religious service: Not on file    Active  member of club or organization: Not on file    Attends meetings of clubs or organizations: Not on file    Relationship status: Not on file  Other Topics Concern  . Not on file  Social History Narrative  . Not on file     Family History: The patient's family history includes Arthritis in his father and sister; CAD in his father; Diabetes in his father; Hyperlipidemia in his father. ROS:   Please see the history of present illness.    All other systems reviewed and are negative.  EKGs/Labs/Other Studies Reviewed:    The following studies were reviewed today:  Recent Labs: 11/23/2018: ALT 54; BUN 16; Creatinine, Ser 0.93; Potassium 4.8; Sodium 142  Recent Lipid Panel    Component Value Date/Time   CHOL 105 11/23/2018 0817   TRIG 112 11/23/2018 0817   HDL 43 11/23/2018 0817   CHOLHDL 2.4 11/23/2018 0817   CHOLHDL 3.4 08/19/2017 1629   VLDL 19 04/08/2016 0832   LDLCALC 40 11/23/2018 0817   LDLCALC 74 08/19/2017 1629    Physical Exam:    VS:  BP 122/78 (BP Location: Left Arm, Patient Position: Sitting)   Pulse 72   Ht 5\' 7"  (1.702 m)   Wt 175 lb (79.4 kg)   BMI 27.41 kg/m     Wt Readings from Last 3 Encounters:  05/12/19 175 lb (79.4 kg)  04/12/19 177 lb (80.3 kg)  03/01/19 181 lb (82.1 kg)     Constitutional, well-nourished well-developed in no acute distress Vital signs reviewed Eyes, conjunctiva and sclera are normal without pallor or icterus extraocular motions intact and normal there is no lid lag Respiratory, normal effort and excursion no audible wheezing without a stethoscope Cardiovascular, no neck vein distention or peripheral edema Skin, no rash skin lesion or ulceration of the extremities Neurologic, cranial nerves II to XII are grossly intact and the patient moves all 4 extremities Neuro/Psychiatric, judgment and thought processes are intact and coherent, alert and oriented x3, mood and affect appear normal.   Signed, Frederick HerrlichBrian Remijio Holleran, MD  05/12/2019  9:19 AM     [facility]

## 2019-05-12 ENCOUNTER — Other Ambulatory Visit: Payer: Self-pay

## 2019-05-12 ENCOUNTER — Encounter: Payer: Self-pay | Admitting: Cardiology

## 2019-05-12 ENCOUNTER — Telehealth (INDEPENDENT_AMBULATORY_CARE_PROVIDER_SITE_OTHER): Payer: BC Managed Care – PPO | Admitting: Cardiology

## 2019-05-12 VITALS — BP 122/78 | HR 72 | Ht 67.0 in | Wt 175.0 lb

## 2019-05-12 DIAGNOSIS — E785 Hyperlipidemia, unspecified: Secondary | ICD-10-CM

## 2019-05-12 DIAGNOSIS — I2510 Atherosclerotic heart disease of native coronary artery without angina pectoris: Secondary | ICD-10-CM | POA: Diagnosis not present

## 2019-05-12 DIAGNOSIS — R7989 Other specified abnormal findings of blood chemistry: Secondary | ICD-10-CM

## 2019-05-12 DIAGNOSIS — R945 Abnormal results of liver function studies: Secondary | ICD-10-CM | POA: Diagnosis not present

## 2019-05-12 MED ORDER — ROSUVASTATIN CALCIUM 20 MG PO TABS: 20.0000 mg | ORAL_TABLET | Freq: Every day | ORAL | 1 refills | Status: DC

## 2019-05-12 NOTE — Addendum Note (Signed)
Addended by: Austin Miles on: 05/12/2019 09:44 AM   Modules accepted: Orders

## 2019-05-12 NOTE — Patient Instructions (Signed)
Medication Instructions:  Your physician has recommended you make the following change in your medication:  INCREASE rosuvastatin (crestor) 20 mg: Take 1 tablet daily in the evening  *If you need a refill on your cardiac medications before your next appointment, please call your pharmacy*  Lab Work: Your physician recommends that you return for lab work tomorrow at [facility]: [place], lipid panel. Please fast beforehand. No appointment needed.   If you have labs (blood work) drawn today and your tests are completely normal, you will receive your results only by: Marland Kitchen MyChart Message (if you have MyChart) OR . A paper copy in the mail If you have any lab test that is abnormal or we need to change your treatment, we will call you to review the results.  Testing/Procedures: None  Follow-Up: At [facility], you and your health needs are our priority.  As part of our continuing mission to provide you with exceptional heart care, we have created designated Provider Care Teams.  These Care Teams include your primary Cardiologist (physician) and Advanced Practice Providers (APPs -  Physician Assistants and Nurse Practitioners) who all work together to provide you with the care you need, when you need it.  Your next appointment:   6 months  The format for your next appointment:   In Person  Provider:   Shirlee More, MD

## 2019-05-13 DIAGNOSIS — I2510 Atherosclerotic heart disease of native coronary artery without angina pectoris: Secondary | ICD-10-CM | POA: Diagnosis not present

## 2019-05-13 DIAGNOSIS — E785 Hyperlipidemia, unspecified: Secondary | ICD-10-CM | POA: Diagnosis not present

## 2019-05-13 DIAGNOSIS — R945 Abnormal results of liver function studies: Secondary | ICD-10-CM | POA: Diagnosis not present

## 2019-05-14 LAB — COMPREHENSIVE METABOLIC PANEL
ALT: 37 IU/L (ref 0–44)
AST: 34 IU/L (ref 0–40)
Albumin/Globulin Ratio: 2.4 — ABNORMAL HIGH (ref 1.2–2.2)
Albumin: 4.7 g/dL (ref 4.0–5.0)
Alkaline Phosphatase: 60 IU/L (ref 39–117)
BUN/Creatinine Ratio: 17 (ref 9–20)
BUN: 15 mg/dL (ref 6–24)
Bilirubin Total: 0.4 mg/dL (ref 0.0–1.2)
CO2: 23 mmol/L (ref 20–29)
Calcium: 9.4 mg/dL (ref 8.7–10.2)
Chloride: 103 mmol/L (ref 96–106)
Creatinine, Ser: 0.9 mg/dL (ref 0.76–1.27)
GFR calc Af Amer: 116 mL/min/{1.73_m2} (ref >59)
GFR calc non Af Amer: 101 mL/min/{1.73_m2} (ref >59)
Globulin, Total: 2 g/dL (ref 1.5–4.5)
Glucose: 99 mg/dL (ref 65–99)
Potassium: 4.7 mmol/L (ref 3.5–5.2)
Sodium: 138 mmol/L (ref 134–144)
Total Protein: 6.7 g/dL (ref 6.0–8.5)

## 2019-05-14 LAB — LIPID PANEL
Chol/HDL Ratio: 2.2 ratio (ref 0.0–5.0)
Cholesterol, Total: 114 mg/dL (ref 100–199)
HDL: 51 mg/dL (ref >39)
LDL Chol Calc (NIH): 46 mg/dL (ref 0–99)
Triglycerides: 88 mg/dL (ref 0–149)
VLDL Cholesterol Cal: 17 mg/dL (ref 5–40)

## 2019-05-17 ENCOUNTER — Other Ambulatory Visit: Payer: Self-pay | Admitting: Sports Medicine

## 2019-05-28 ENCOUNTER — Other Ambulatory Visit: Payer: Self-pay | Admitting: Cardiology

## 2019-06-01 ENCOUNTER — Other Ambulatory Visit: Payer: Self-pay | Admitting: Physician Assistant

## 2019-06-01 NOTE — Telephone Encounter (Signed)
[facility] requesting med refill for tamsulosin. Med not listed in active rx list.

## 2019-06-21 ENCOUNTER — Other Ambulatory Visit: Payer: Self-pay

## 2019-06-21 DIAGNOSIS — I25118 Atherosclerotic heart disease of native coronary artery with other forms of angina pectoris: Secondary | ICD-10-CM

## 2019-06-21 DIAGNOSIS — E785 Hyperlipidemia, unspecified: Secondary | ICD-10-CM

## 2019-06-21 MED ORDER — EZETIMIBE 10 MG PO TABS: ORAL_TABLET | ORAL | 0 refills | Status: DC

## 2019-06-21 NOTE — Telephone Encounter (Signed)
Zetia refill sent to [facility].

## 2019-06-30 ENCOUNTER — Other Ambulatory Visit: Payer: Self-pay | Admitting: Sports Medicine

## 2019-07-06 DIAGNOSIS — L812 Freckles: Secondary | ICD-10-CM | POA: Diagnosis not present

## 2019-07-06 DIAGNOSIS — L570 Actinic keratosis: Secondary | ICD-10-CM | POA: Diagnosis not present

## 2019-07-06 DIAGNOSIS — L814 Other melanin hyperpigmentation: Secondary | ICD-10-CM | POA: Diagnosis not present

## 2019-07-06 DIAGNOSIS — X32XXXS Exposure to sunlight, sequela: Secondary | ICD-10-CM | POA: Diagnosis not present

## 2019-07-27 ENCOUNTER — Other Ambulatory Visit: Payer: Self-pay | Admitting: *Deleted

## 2019-07-27 DIAGNOSIS — F17200 Nicotine dependence, unspecified, uncomplicated: Secondary | ICD-10-CM

## 2019-07-27 MED ORDER — VARENICLINE TARTRATE 1 MG PO TABS: 1.0000 mg | ORAL_TABLET | Freq: Two times a day (BID) | ORAL | 3 refills | Status: DC

## 2019-08-21 DIAGNOSIS — Z20828 Contact with and (suspected) exposure to other viral communicable diseases: Secondary | ICD-10-CM | POA: Diagnosis not present

## 2019-08-26 ENCOUNTER — Other Ambulatory Visit: Payer: Self-pay | Admitting: Sports Medicine

## 2019-09-09 ENCOUNTER — Other Ambulatory Visit: Payer: Self-pay | Admitting: Sports Medicine

## 2019-09-09 MED ORDER — TAMSULOSIN HCL 0.4 MG PO CAPS: ORAL_CAPSULE | ORAL | 1 refills | Status: DC

## 2019-09-14 ENCOUNTER — Encounter: Payer: Self-pay | Admitting: Sports Medicine

## 2019-09-14 ENCOUNTER — Ambulatory Visit (INDEPENDENT_AMBULATORY_CARE_PROVIDER_SITE_OTHER): Payer: BC Managed Care – PPO | Admitting: Sports Medicine

## 2019-09-14 DIAGNOSIS — M79671 Pain in right foot: Secondary | ICD-10-CM

## 2019-09-14 DIAGNOSIS — M7541 Impingement syndrome of right shoulder: Secondary | ICD-10-CM | POA: Diagnosis not present

## 2019-09-14 DIAGNOSIS — J45990 Exercise induced bronchospasm: Secondary | ICD-10-CM

## 2019-09-14 HISTORY — DX: Exercise induced bronchospasm: J45.990

## 2019-09-14 MED ORDER — ALBUTEROL SULFATE HFA 108 (90 BASE) MCG/ACT IN AERS: INHALATION_SPRAY | RESPIRATORY_TRACT | 11 refills | Status: DC

## 2019-09-14 NOTE — Progress Notes (Addendum)
    Procedures performed today:    Procedure: Real-time Ultrasound Guided injection of the right glenohumeral joint Device: Samsung HS60  Verbal informed consent obtained.  Time-out conducted.  Noted no overlying erythema, induration, or other signs of local infection.  Skin prepped in a sterile fashion.  Local anesthesia: Topical Ethyl chloride.  With sterile technique and under real time ultrasound guidance: 1 cc Kenalog 40, 2 cc lidocaine, 2 cc bupivacaine injected easily Completed without difficulty  Pain immediately resolved suggesting accurate placement of the medication.  Advised to call if fevers/chills, erythema, induration, drainage, or persistent bleeding.  Images permanently stored and available for review in the ultrasound unit.  Impression: Technically successful ultrasound guided injection.  Independent interpretation of notes and tests performed by another provider:   None.  Impression and Recommendations:    Impingement syndrome of right shoulder Frederick Ward continues to have right shoulder pain, worse with cocking his arm back as if throwing a ball. On exam pain is more referable to the glenohumeral joint. He really does not have any impingement signs today. Glenohumeral injection, he has failed greater than 6 weeks of conservative treatment. Return to see me in 1 month.  Right foot pain Has historically done well with custom orthotics, needs a new pair, referral to Dr. Jordan Likes.  Exercise-induced bronchoconstriction Frederick Ward has had episodes of chest tightness initially when working out, after the initial period of chest tightness he is able to then work out without a problem. As his symptoms improve with activity this is unlikely to represent angina, I think it is more exercise-induced bronchoconstriction. Particular considering he reports a refractory.  After the initial episode where he can exercise free of chest tightness. Adding albuterol 2 puffs 15 minutes  before physical activity. We can revisit this in 1 month, we will likely add an inhaled corticosteroid if no better.    ___________________________________________ Frederick Ward. Frederick Ward, M.D., ABFM., CAQSM. Primary Care and Sports Medicine [facility]  Adjunct Instructor of Family Medicine  [employer] Medicine

## 2019-09-14 NOTE — Addendum Note (Signed)
Addended by: Monica Becton on: 09/14/2019 02:18 PM   Modules accepted: Orders

## 2019-09-14 NOTE — Assessment & Plan Note (Signed)
Has historically done well with custom orthotics, needs a new pair, referral to Dr. Jordan Likes.

## 2019-09-14 NOTE — Assessment & Plan Note (Signed)
Frederick Ward has had episodes of chest tightness initially when working out, after the initial period of chest tightness he is able to then work out without a problem. As his symptoms improve with activity this is unlikely to represent angina, I think it is more exercise-induced bronchoconstriction. Particular considering he reports a refractory.  After the initial episode where he can exercise free of chest tightness. Adding albuterol 2 puffs 15 minutes before physical activity. We can revisit this in 1 month, we will likely add an inhaled corticosteroid if no better.

## 2019-09-14 NOTE — Assessment & Plan Note (Signed)
Frederick Ward continues to have right shoulder pain, worse with cocking his arm back as if throwing a ball. On exam pain is more referable to the glenohumeral joint. He really does not have any impingement signs today. Glenohumeral injection, he has failed greater than 6 weeks of conservative treatment. Return to see me in 1 month.

## 2019-09-23 ENCOUNTER — Other Ambulatory Visit: Payer: Self-pay | Admitting: Cardiology

## 2019-09-23 DIAGNOSIS — I25118 Atherosclerotic heart disease of native coronary artery with other forms of angina pectoris: Secondary | ICD-10-CM

## 2019-09-23 DIAGNOSIS — E785 Hyperlipidemia, unspecified: Secondary | ICD-10-CM

## 2019-10-18 ENCOUNTER — Ambulatory Visit: Payer: BC Managed Care – PPO | Admitting: Sports Medicine

## 2019-10-22 ENCOUNTER — Other Ambulatory Visit: Payer: Self-pay | Admitting: Sports Medicine

## 2019-10-31 ENCOUNTER — Other Ambulatory Visit: Payer: Self-pay | Admitting: Cardiology

## 2019-11-16 NOTE — Progress Notes (Signed)
Cardiology Office Note:    Date:  11/17/2019   ID:  Frederick Ward, DOB 02-Dec-1970, MRN [id number]  PCP:  Monica Becton, MD  Cardiologist:  Norman Herrlich, MD    Referring MD: Monica Becton,*    ASSESSMENT:    1. Mild CAD   2. Hyperlipidemia LDL goal <70    PLAN:    In order of problems listed above:  1. He continues to do well New York Heart Association class I no anginal discomfort continue his current medical therapy including aspirin beta-blocker and combined high intensity statin and Zetia for dyslipidemia.  We will go ahead and check liver function CMP today renew prescriptions and in the absence of a change clinically with having angina I will see in the office in 1 year.  His father is pending TAVR and had recent multivessel PCI at [facility]   Next appointment: 1 year   Medication Adjustments/Labs and Tests Ordered: Current medicines are reviewed at length with the patient today.  Concerns regarding medicines are outlined above.  No orders of the defined types were placed in this encounter.  No orders of the defined types were placed in this encounter.   Chief Complaint  Patient presents with  . Follow-up  . Coronary Artery Disease    History of Present Illness:    Frederick Ward is a 49 y.o. male with a hx of  CAD and dyslipidemia  last seen 05/12/2019.  Cardiac CTA 08/25/2017 showed calcium score of 274 90th percentile for age and sex and sex, coronary artery disease 50% or less with normal fractional flow reserve. Compliance with diet, lifestyle and medications: Yes  Frederick Ward continues to do well tolerates lipid-lowering therapy without muscle pain or weakness and on medical treatment has had no anginal discomfort shortness of breath palpitation or syncope.  He inquires about follow-up repeat CTA and I told him in general I would not do that at least for 5 years unless he has a change in his clinical profile.  His wife had COVID-19 he did not and he is  still trying to decide about immunization strongly encouraged him to do it as quickly as possible.  We will need to check labs today lipid profile CMP is EKG is normal I plan to see him in the office in 1 year or sooner if needed Past Medical History:  Diagnosis Date  . Annual physical exam 07/26/2014  . Chest pain 08/25/2017  . 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 DDD (degenerative disc disease), cervical 05/22/2018  . Exercise-induced bronchoconstriction 09/14/2019  . History of allergic reaction 03/21/2014   Formatting of this note might be different from the original. Bee sting  . History of right peroneus brevis tear post repair 07/26/2014  . Hyperlipidemia 07/26/2014  . Impingement syndrome of right shoulder 03/01/2019  . Lumbar facet arthropathy 02/23/2018  . Lumbar facet joint syndrome 04/11/2016  . Mild CAD 12/24/2017  . Obstructive uropathy 03/19/2016  . Osteoarthritis of both knees 07/26/2014   Postarthroscopy and partial meniscectomy on the right. Unloader brace on the right. Status post anterior cruciate ligament repair with reconstruction on the left.  Orthovisc approved   . Other and unspecified hyperlipidemia 03/21/2014   Formatting of this note might be different from the original. Diet controlled  . Pain    LEFT SHOULDER- ROTATOR CUFF  TEAR  . Primary osteoarthritis of both knees 07/26/2014   Postarthroscopy and partial meniscectomy on the right. Unloader brace on the right. Status post  anterior cruciate ligament repair with reconstruction on the left.  Orthovisc approved   . Primary osteoarthritis of first carpometacarpal joint of right hand 06/04/2016  . 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 Right foot pain 03/01/2019  . Rotator cuff tear, left 06/18/2012  . S/P ventriculoperitoneal shunt 07/26/2014  . Smoker 08/19/2017  . Stable angina (HCC) 08/19/2017  . Tinea pedis, left 05/22/2018  . 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 Yellow jacket sting allergy     Past Surgical History:  Procedure Laterality Date  . ANTERIOR CRUCIATE LIGAMENT REPAIR  1997 OR 1998   LEFT KNEE  . LEFT ANKLE TENDON TRANSFER  FEB 2010  . RIGHT ANKLE TENDON TRANSFER  SEPT 30, 2013   SURGERY WAS DONE IN [facility]--PT WEARS  ORTHOTIC BOOT WHEN AMBULATING--HEALING INCISION--SOME WEAKNESS STILL  . RIGHT WRIST FIXATION AUG  2006    . SHOULDER ARTHROSCOPY WITH ROTATOR CUFF REPAIR AND SUBACROMIAL DECOMPRESSION  06/18/2012   Procedure: SHOULDER ARTHROSCOPY WITH ROTATOR CUFF REPAIR AND SUBACROMIAL DECOMPRESSION;  Surgeon: Javier Docker, MD;  Location: [facility];  Service: Orthopedics;  Laterality: Left;  Left Shoulder arthroscopy subacromial decompression mini open rotator cuff repair   . VENTRICULOPERITONEAL SHUNT      Current Medications: Current Meds  Medication Sig  . albuterol (VENTOLIN HFA) 108 (90 Base) MCG/ACT inhaler 2 puffs 15 minutes before physical activity  . ASPIRIN ADULT LOW STRENGTH 81 MG EC tablet TAKE 1 TABLET BY MOUTH EVERY DAY  . dutasteride (AVODART) 0.5 MG capsule Take 0.5 mg by mouth daily.  Marland Kitchen EPINEPHrine 0.3 mg/0.3 mL IJ SOAJ injection   . ezetimibe (ZETIA) 10 MG tablet TAKE 1 TABLET BY MOUTH DAILY GENERIC EQUIVALENT FOR ZETIA  . fish oil-omega-3 fatty acids 1000 MG capsule Take 1 g by mouth daily.  . meloxicam (MOBIC) 15 MG tablet TAKE 1 TABLET BY MOUTH EVERY MORNING WITH BREAKFAST FOR 2 WEEKS AND THEN DAILY AS NEEDED FOR PAIN  . metoprolol succinate (TOPROL-XL) 50 MG 24 hr tablet TAKE 1 TABLET BY MOUTH DAILY. TAKE WITH OR IMMEDIATELY FOLLOWING A MEAL  . Multiple Vitamin (MULTIVITAMIN WITH MINERALS) TABS Take 1 tablet by mouth daily.  . nitroGLYCERIN (NITROSTAT) 0.4 MG SL tablet Place 1 tablet (0.4 mg total) under the tongue every 5 (five) minutes as needed for chest pain (or tightness).  . rosuvastatin (CRESTOR) 20 MG tablet TAKE 1 TABLET BY MOUTH EVERY DAY    . tamsulosin (FLOMAX) 0.4 MG CAPS capsule TAKE 2 CAPSULES BY MOUTH DAILY AFTER BREAKFAST. GENERIC EQUIVALENT FOR FLOMAX     Allergies:   Bee pollen and Bee venom   Social History   Socioeconomic History  . Marital status: Married    Spouse name: Not on file  . Number of children: Not on file  . Years of education: Not on file  . Highest education level: Not on file  Occupational History  . Not on file  Tobacco Use  . Smoking status: Former Smoker    Packs/day: 1.00    Years: 20.00    Pack years: 20.00    Types: Cigarettes  . Smokeless tobacco: Never Used  . Tobacco comment: QUIT SMOKING 2011, has restarted   Substance and Sexual Activity  . Alcohol use: No  . Drug use: No  . Sexual activity: Not on file  Other Topics Concern  . Not on file  Social History Narrative  . Not on file   Social Determinants of Health   Financial Resource Strain:   . Difficulty of Paying Living Expenses:   Food Insecurity:   . Worried About Charity fundraiser in the Last Year:   . Arboriculturist in the Last Year:   Transportation Needs:   . Film/video editor (Medical):   Marland Kitchen Lack of Transportation (Non-Medical):   Physical Activity:   . Days of Exercise per Week:   . Minutes of Exercise per Session:   Stress:   . Feeling of Stress :   Social Connections:   . Frequency of Communication with Friends and Family:   . Frequency of Social Gatherings with Friends and Family:   . Attends Religious Services:   . Active Member of Clubs or Organizations:   . Attends Archivist Meetings:   Marland Kitchen Marital Status:      Family History: The patient's family history includes Arthritis in his father and sister; CAD in his father; Diabetes in his father; Hyperlipidemia in his father. ROS:   Please see the history of present illness.    All other systems reviewed and are negative.  EKGs/Labs/Other Studies Reviewed:    The following studies were reviewed today:  EKG:  EKG ordered  today and personally reviewed.  The ekg ordered today demonstrates sinus rhythm normal  Recent Labs: 05/13/2019: ALT 37; BUN 15; Creatinine, Ser 0.90; Potassium 4.7; Sodium 138  Recent Lipid Panel    Component Value Date/Time   CHOL 114 05/13/2019 0852   TRIG 88 05/13/2019 0852   HDL 51 05/13/2019 0852   CHOLHDL 2.2 05/13/2019 0852   CHOLHDL 3.4 08/19/2017 1629   VLDL 19 04/08/2016 0832   LDLCALC 46 05/13/2019 0852   LDLCALC 74 08/19/2017 1629    Physical Exam:    VS:  BP 122/78   Pulse 66   Temp 97.7 F (36.5 C)   Ht 5\' 7"  (1.702 m)   Wt 189 lb 0.6 oz (85.7 kg)   SpO2 98%   BMI 29.61 kg/m     Wt Readings from Last 3 Encounters:  11/17/19 189 lb 0.6 oz (85.7 kg)  09/14/19 190 lb (86.2 kg)  05/12/19 175 lb (79.4 kg)     GEN:  Well nourished, well developed in no acute distress HEENT: Normal NECK: No JVD; No carotid bruits LYMPHATICS: No lymphadenopathy CARDIAC: RRR, no murmurs, rubs, gallops RESPIRATORY:  Clear to auscultation without rales, wheezing or rhonchi  ABDOMEN: Soft, non-tender, non-distended MUSCULOSKELETAL:  No edema; No deformity  SKIN: Warm and dry NEUROLOGIC:  Alert and oriented x 3 PSYCHIATRIC:  Normal affect    Signed, Shirlee More, MD  11/17/2019 10:14 AM    Derma

## 2019-11-17 ENCOUNTER — Ambulatory Visit (INDEPENDENT_AMBULATORY_CARE_PROVIDER_SITE_OTHER): Payer: BC Managed Care – PPO | Admitting: Cardiology

## 2019-11-17 ENCOUNTER — Other Ambulatory Visit: Payer: Self-pay

## 2019-11-17 ENCOUNTER — Encounter: Payer: Self-pay | Admitting: Cardiology

## 2019-11-17 VITALS — BP 122/78 | HR 66 | Temp 97.7°F | Ht 67.0 in | Wt 189.0 lb

## 2019-11-17 DIAGNOSIS — E785 Hyperlipidemia, unspecified: Secondary | ICD-10-CM

## 2019-11-17 DIAGNOSIS — I2510 Atherosclerotic heart disease of native coronary artery without angina pectoris: Secondary | ICD-10-CM

## 2019-11-17 NOTE — Patient Instructions (Signed)

## 2019-11-18 ENCOUNTER — Telehealth: Payer: Self-pay

## 2019-11-18 LAB — COMPREHENSIVE METABOLIC PANEL
ALT: 31 IU/L (ref 0–44)
AST: 29 IU/L (ref 0–40)
Albumin/Globulin Ratio: 2.3 — ABNORMAL HIGH (ref 1.2–2.2)
Albumin: 4.8 g/dL (ref 4.0–5.0)
Alkaline Phosphatase: 67 IU/L (ref 39–117)
BUN/Creatinine Ratio: 13 (ref 9–20)
BUN: 13 mg/dL (ref 6–24)
Bilirubin Total: 0.2 mg/dL (ref 0.0–1.2)
CO2: 23 mmol/L (ref 20–29)
Calcium: 10.5 mg/dL — ABNORMAL HIGH (ref 8.7–10.2)
Chloride: 101 mmol/L (ref 96–106)
Creatinine, Ser: 0.99 mg/dL (ref 0.76–1.27)
GFR calc Af Amer: 104 mL/min/{1.73_m2} (ref >59)
GFR calc non Af Amer: 90 mL/min/{1.73_m2} (ref >59)
Globulin, Total: 2.1 g/dL (ref 1.5–4.5)
Glucose: 102 mg/dL — ABNORMAL HIGH (ref 65–99)
Potassium: 4.7 mmol/L (ref 3.5–5.2)
Sodium: 140 mmol/L (ref 134–144)
Total Protein: 6.9 g/dL (ref 6.0–8.5)

## 2019-11-18 LAB — LIPID PANEL
Chol/HDL Ratio: 2.3 ratio (ref 0.0–5.0)
Cholesterol, Total: 97 mg/dL — ABNORMAL LOW (ref 100–199)
HDL: 42 mg/dL (ref >39)
LDL Chol Calc (NIH): 30 mg/dL (ref 0–99)
Triglycerides: 151 mg/dL — ABNORMAL HIGH (ref 0–149)
VLDL Cholesterol Cal: 25 mg/dL (ref 5–40)

## 2019-11-18 NOTE — Telephone Encounter (Signed)
-----   Message from Baldo Daub, MD sent at 11/18/2019  8:19 AM EDT ----- Normal or stable result  Good result no changes, his calcium is mildly elevated if he is taking over-the-counter supplements I would have him stop.

## 2019-11-18 NOTE — Telephone Encounter (Signed)
Spoke with patient regarding results and recommendation.  Patient verbalizes understanding and is agreeable to plan of care. Advised patient to call back with any issues or concerns.  

## 2019-11-25 ENCOUNTER — Other Ambulatory Visit: Payer: Self-pay | Admitting: Sports Medicine

## 2019-11-25 MED ORDER — DUTASTERIDE 0.5 MG PO CAPS: 0.5000 mg | ORAL_CAPSULE | Freq: Every day | ORAL | 3 refills | Status: DC

## 2019-11-30 ENCOUNTER — Other Ambulatory Visit: Payer: Self-pay | Admitting: Sports Medicine

## 2019-11-30 DIAGNOSIS — F17200 Nicotine dependence, unspecified, uncomplicated: Secondary | ICD-10-CM

## 2019-12-14 ENCOUNTER — Other Ambulatory Visit: Payer: Self-pay | Admitting: Sports Medicine

## 2019-12-14 MED ORDER — MELOXICAM 15 MG PO TABS: 15.0000 mg | ORAL_TABLET | Freq: Every day | ORAL | 3 refills | Status: DC | PRN

## 2019-12-24 ENCOUNTER — Ambulatory Visit: Payer: BC Managed Care – PPO | Admitting: Cardiology

## 2020-01-26 ENCOUNTER — Other Ambulatory Visit: Payer: Self-pay | Admitting: Cardiology

## 2020-01-26 DIAGNOSIS — E785 Hyperlipidemia, unspecified: Secondary | ICD-10-CM

## 2020-01-26 DIAGNOSIS — I25118 Atherosclerotic heart disease of native coronary artery with other forms of angina pectoris: Secondary | ICD-10-CM

## 2020-02-27 ENCOUNTER — Other Ambulatory Visit: Payer: Self-pay | Admitting: Sports Medicine

## 2020-04-14 ENCOUNTER — Ambulatory Visit (INDEPENDENT_AMBULATORY_CARE_PROVIDER_SITE_OTHER): Payer: BC Managed Care – PPO | Admitting: Sports Medicine

## 2020-04-14 ENCOUNTER — Telehealth: Payer: Self-pay | Admitting: Sports Medicine

## 2020-04-14 ENCOUNTER — Encounter: Payer: Self-pay | Admitting: Sports Medicine

## 2020-04-14 DIAGNOSIS — N139 Obstructive and reflux uropathy, unspecified: Secondary | ICD-10-CM | POA: Diagnosis not present

## 2020-04-14 DIAGNOSIS — M170 Bilateral primary osteoarthritis of knee: Secondary | ICD-10-CM

## 2020-04-14 DIAGNOSIS — M19071 Primary osteoarthritis, right ankle and foot: Secondary | ICD-10-CM | POA: Diagnosis not present

## 2020-04-14 MED ORDER — TADALAFIL 5 MG PO TABS: 5.0000 mg | ORAL_TABLET | Freq: Every day | ORAL | 11 refills | Status: DC

## 2020-04-14 NOTE — Addendum Note (Signed)
Addended by: Monica Becton on: 04/14/2020 11:15 AM   Modules accepted: Orders

## 2020-04-14 NOTE — Telephone Encounter (Signed)
Please work on [employer] or other viscosupplementation approval, feel free to forward this to whoever is doing these now.

## 2020-04-14 NOTE — Assessment & Plan Note (Signed)
Frederick Ward returns, he is a very pleasant 49 year old male, he has known bilateral knee osteoarthritis, now worsening. Did okay after Synvisc back in 2019, he is not looking into a steroid injection just yet, he has done NSAIDs and Tylenol, x-rays confirmed osteoarthritis, he would like to try viscosupplementation again, we will work on getting this approved. I also mentioned trying a Mediterranean type diet.

## 2020-04-14 NOTE — Assessment & Plan Note (Signed)
Really not getting much success with Flomax and Avodart. He feels as though the Flomax is the least effective, discontinue Flomax and switching to Cialis 5 sent to [facility]. He will avoid nitroglycerin. Return in 1 month to discuss this.

## 2020-04-14 NOTE — Progress Notes (Signed)
    Procedures performed today:    None.  Independent interpretation of notes and tests performed by another provider:   None.  Brief History, Exam, Impression, and Recommendations:    Primary osteoarthritis of both knees Frederick Ward returns, he is a very pleasant 49 year old male, he has known bilateral knee osteoarthritis, now worsening. Did okay after Synvisc back in 2019, he is not looking into a steroid injection just yet, he has done NSAIDs and Tylenol, x-rays confirmed osteoarthritis, he would like to try viscosupplementation again, we will work on getting this approved. I also mentioned trying a Mediterranean type diet.  Obstructive uropathy Really not getting much success with Flomax and Avodart. He feels as though the Flomax is the least effective, discontinue Flomax and switching to Cialis 5 sent to [facility]. He will avoid nitroglycerin. Return in 1 month to discuss this.    ___________________________________________ [name]. Benjamin Stain, M.D., ABFM., CAQSM. Primary Care and Sports Medicine [facility]  Adjunct Instructor of Family Medicine  [employer]

## 2020-04-14 NOTE — Patient Instructions (Signed)
Mediterranean Diet A Mediterranean diet refers to food and lifestyle choices that are based on the traditions of countries located on the [place] Sea. This way of eating has been shown to help prevent certain conditions and improve outcomes for people who have chronic diseases, like kidney disease and heart disease. What are tips for following this plan? Lifestyle  Cook and eat meals together with your family, when possible.  Drink enough fluid to keep your urine clear or pale yellow.  Be physically active every day. This includes: ? Aerobic exercise like running or swimming. ? Leisure activities like gardening, walking, or housework.  Get 7-8 hours of sleep each night.  If recommended by your health care provider, drink red wine in moderation. This means 1 glass a day for nonpregnant women and 2 glasses a day for men. A glass of wine equals 5 oz (150 mL). Reading food labels  Check the serving size of packaged foods. For foods such as rice and pasta, the serving size refers to the amount of cooked product, not dry.  Check the total fat in packaged foods. Avoid foods that have saturated fat or trans fats.  Check the ingredients list for added sugars, such as corn syrup.   Shopping  At the grocery store, buy most of your food from the areas near the walls of the store. This includes: ? Fresh fruits and vegetables (produce). ? Grains, beans, nuts, and seeds. Some of these may be available in unpackaged forms or large amounts (in bulk). ? Fresh seafood. ? Poultry and eggs. ? Low-fat dairy products.  Buy whole ingredients instead of prepackaged foods.  Buy fresh fruits and vegetables in-season from local farmers markets.  Buy frozen fruits and vegetables in resealable bags.  If you do not have access to quality fresh seafood, buy precooked frozen shrimp or canned fish, such as tuna, salmon, or sardines.  Buy small amounts of raw or cooked vegetables, salads, or olives from  the deli or salad bar at your store.  Stock your pantry so you always have certain foods on hand, such as olive oil, canned tuna, canned tomatoes, rice, pasta, and beans. Cooking  Cook foods with extra-virgin olive oil instead of using butter or other vegetable oils.  Have meat as a side dish, and have vegetables or grains as your main dish. This means having meat in small portions or adding small amounts of meat to foods like pasta or stew.  Use beans or vegetables instead of meat in common dishes like chili or lasagna.  Experiment with different cooking methods. Try roasting or broiling vegetables instead of steaming or sauteing them.  Add frozen vegetables to soups, stews, pasta, or rice.  Add nuts or seeds for added healthy fat at each meal. You can add these to yogurt, salads, or vegetable dishes.  Marinate fish or vegetables using olive oil, lemon juice, garlic, and fresh herbs. Meal planning  Plan to eat 1 vegetarian meal one day each week. Try to work up to 2 vegetarian meals, if possible.  Eat seafood 2 or more times a week.  Have healthy snacks readily available, such as: ? Vegetable sticks with hummus. ? Greek yogurt. ? Fruit and nut trail mix.  Eat balanced meals throughout the week. This includes: ? Fruit: 2-3 servings a day ? Vegetables: 4-5 servings a day ? Low-fat dairy: 2 servings a day ? Fish, poultry, or lean meat: 1 serving a day ? Beans and legumes: 2 or more servings a week ?   Nuts and seeds: 1-2 servings a day ? Whole grains: 6-8 servings a day ? Extra-virgin olive oil: 3-4 servings a day  Limit red meat and sweets to only a few servings a month   What are my food choices?  Mediterranean diet ? Recommended  Grains: Whole-grain pasta. Brown rice. Bulgar wheat. Polenta. Couscous. Whole-wheat bread. Oatmeal. Quinoa.  Vegetables: Artichokes. Beets. Broccoli. Cabbage. Carrots. Eggplant. Green beans. Chard. Kale. Spinach. Onions. Leeks. Peas. Squash.  Tomatoes. Peppers. Radishes.  Fruits: Apples. Apricots. Avocado. Berries. Bananas. Cherries. Dates. Figs. Grapes. Lemons. Melon. Oranges. Peaches. Plums. Pomegranate.  Meats and other protein foods: Beans. Almonds. Sunflower seeds. Pine nuts. Peanuts. Cod. Salmon. Scallops. Shrimp. Tuna. Tilapia. Clams. Oysters. Eggs.  Dairy: Low-fat milk. Cheese. Greek yogurt.  Beverages: Water. Red wine. Herbal tea.  Fats and oils: Extra virgin olive oil. Avocado oil. Grape seed oil.  Sweets and desserts: Greek yogurt with honey. Baked apples. Poached pears. Trail mix.  Seasoning and other foods: Basil. Cilantro. Coriander. Cumin. Mint. Parsley. Sage. Rosemary. Tarragon. Garlic. Oregano. Thyme. Pepper. Balsalmic vinegar. Tahini. Hummus. Tomato sauce. Olives. Mushrooms. ? Limit these  Grains: Prepackaged pasta or rice dishes. Prepackaged cereal with added sugar.  Vegetables: Deep fried potatoes (french fries).  Fruits: Fruit canned in syrup.  Meats and other protein foods: Beef. Pork. Lamb. Poultry with skin. Hot dogs. Bacon.  Dairy: Ice cream. Sour cream. Whole milk.  Beverages: Juice. Sugar-sweetened soft drinks. Beer. Liquor and spirits.  Fats and oils: Butter. Canola oil. Vegetable oil. Beef fat (tallow). Lard.  Sweets and desserts: Cookies. Cakes. Pies. Candy.  Seasoning and other foods: Mayonnaise. Premade sauces and marinades. The items listed may not be a complete list. Talk with your dietitian about what dietary choices are right for you. Summary  The Mediterranean diet includes both food and lifestyle choices.  Eat a variety of fresh fruits and vegetables, beans, nuts, seeds, and whole grains.  Limit the amount of red meat and sweets that you eat.  Talk with your health care provider about whether it is safe for you to drink red wine in moderation. This means 1 glass a day for nonpregnant women and 2 glasses a day for men. A glass of wine equals 5 oz (150 mL). This information  is not intended to replace advice given to you by your health care provider. Make sure you discuss any questions you have with your health care provider. Document Revised: 02/29/2016 Document Reviewed: 02/22/2016 Elsevier Patient Education  2020 Elsevier Inc.  

## 2020-04-17 ENCOUNTER — Other Ambulatory Visit: Payer: Self-pay | Admitting: Sports Medicine

## 2020-04-17 DIAGNOSIS — F17200 Nicotine dependence, unspecified, uncomplicated: Secondary | ICD-10-CM

## 2020-04-19 NOTE — Telephone Encounter (Signed)
Started Orthovisc process waiting on BID and to see if PA is required. - CF 

## 2020-04-20 NOTE — Telephone Encounter (Signed)
Received BID Patient will be covered at 100% once out of pocket is met. They will be responsible for one copay per date of service and PA is required. Approximate cost is 2400.00 plus the balance of medication once approved and insurance pays. - CF

## 2020-05-05 NOTE — Telephone Encounter (Signed)
Sent PA for orthovisc through cover my meds waiting on determination. - CF

## 2020-05-11 ENCOUNTER — Ambulatory Visit (INDEPENDENT_AMBULATORY_CARE_PROVIDER_SITE_OTHER): Payer: BC Managed Care – PPO | Admitting: Sports Medicine

## 2020-05-11 ENCOUNTER — Other Ambulatory Visit: Payer: Self-pay

## 2020-05-11 ENCOUNTER — Encounter: Payer: Self-pay | Admitting: Sports Medicine

## 2020-05-11 DIAGNOSIS — M170 Bilateral primary osteoarthritis of knee: Secondary | ICD-10-CM | POA: Diagnosis not present

## 2020-05-11 DIAGNOSIS — N139 Obstructive and reflux uropathy, unspecified: Secondary | ICD-10-CM

## 2020-05-11 MED ORDER — TADALAFIL 10 MG PO TABS: 10.0000 mg | ORAL_TABLET | Freq: Every day | ORAL | 11 refills | Status: DC

## 2020-05-11 NOTE — Assessment & Plan Note (Signed)
Still awaiting Orthovisc approval. He did well in 2019 after a series of Synvisc. He has done NSAIDs, Tylenol, Mediterranean type diet was recommended at the last visit as well.

## 2020-05-11 NOTE — Progress Notes (Signed)
    Procedures performed today:    None.  Independent interpretation of notes and tests performed by another provider:   None.  Brief History, Exam, Impression, and Recommendations:    Obstructive uropathy Frederick Ward returns, he really did not have much success with Avodart or Flomax, we discontinued Flomax then switched to tadalafil 5 sent to [facility]. He understands to avoid nitroglycerin. Only minimal improvement, switching to tadalafil 10 mg daily. In the future we could also consider Rapaflo instead of Flomax. Return to see me in 4 to 6 weeks, if persistent symptoms we may consider urology referral. Of note he is really not interested in any surgical procedure such as TURP.  Primary osteoarthritis of both knees Still awaiting Orthovisc approval. He did well in 2019 after a series of Synvisc. He has done NSAIDs, Tylenol, Mediterranean type diet was recommended at the last visit as well.    ___________________________________________ Frederick Ward. Frederick Ward, M.D., ABFM., CAQSM. Primary Care and Sports Medicine [facility]  Adjunct Instructor of Family Medicine  [employer]

## 2020-05-11 NOTE — Telephone Encounter (Signed)
I called [employer] to check on Pa they stated they needed medical records showing patient had tried Synvisc in the past I faxed the records today now waiting on determination. - CF

## 2020-05-11 NOTE — Assessment & Plan Note (Signed)
Frederick Ward returns, he really did not have much success with Avodart or Flomax, we discontinued Flomax then switched to tadalafil 5 sent to [facility]. He understands to avoid nitroglycerin. Only minimal improvement, switching to tadalafil 10 mg daily. In the future we could also consider Rapaflo instead of Flomax. Return to see me in 4 to 6 weeks, if persistent symptoms we may consider urology referral. Of note he is really not interested in any surgical procedure such as TURP.

## 2020-05-16 ENCOUNTER — Other Ambulatory Visit: Payer: Self-pay | Admitting: Cardiology

## 2020-05-16 MED ORDER — SYNVISC 16 MG/2ML IX SOSY: PREFILLED_SYRINGE | INTRA_ARTICULAR | 3 refills | Status: DC

## 2020-05-16 NOTE — Addendum Note (Signed)
Addended by: Monica Becton on: 05/16/2020 02:47 PM   Modules accepted: Orders

## 2020-05-16 NOTE — Telephone Encounter (Signed)
I received fax from [facility] they denied coverage on Orthovisc due to patient must try and fail Synvisc or Synvisc one and one other preferred medication specifically Durolane or Gelsyn 3 placing in providers box for review. - CF

## 2020-05-16 NOTE — Telephone Encounter (Signed)
Orthovisc denied, sending in Synvisc.

## 2020-05-24 DIAGNOSIS — Z91030 Bee allergy status: Secondary | ICD-10-CM | POA: Insufficient documentation

## 2020-05-24 DIAGNOSIS — R52 Pain, unspecified: Secondary | ICD-10-CM | POA: Insufficient documentation

## 2020-05-29 NOTE — Progress Notes (Signed)
Cardiology Office Note:    Date:  05/30/2020   ID:  Frederick Ward, DOB 1970/07/17, MRN [id number]  PCP:  Monica Becton, MD  Cardiologist:  Norman Herrlich, MD    Referring MD: Monica Becton,*    ASSESSMENT:    1. Mild CAD   2. Hyperlipidemia, unspecified hyperlipidemia type   3. Weight gain    PLAN:    In order of problems listed above:  1. Stable CAD having no angina on medical therapy we will continue his treatment including low-dose aspirin beta-blocker high intensity statin and Zetia recheck lipids goal is LDL less than 55 and consider an ischemia evaluation next visit.   Strongly encouraged him to lose weight and if his sleep disturbance continues would need a sleep study  Next appointment: 6 months   Medication Adjustments/Labs and Tests Ordered: Current medicines are reviewed at length with the patient today.  Concerns regarding medicines are outlined above.  Orders Placed This Encounter  Procedures  . Comprehensive metabolic panel  . Lipid panel   No orders of the defined types were placed in this encounter.   Chief Complaint  Patient presents with  . Follow-up  . Coronary Artery Disease  . Hyperlipidemia    History of Present Illness:    Frederick Ward is a 49 y.o. male with a hx of CAD and dyslipidemia last seen 11/17/2019.Cardiac CTA 08/25/2017 showed calcium score of 274 90th percentile for age and sex and sex, coronary artery disease 50% or less with normal fractional flow reserve.   Compliance with diet, lifestyle and medications: Yes  He has had a difficult time working third shift and is gained 25 pounds in 6 months and notices that he awakens frequently at night and is concerned about sleep apnea.  As opposed to doing a sleep study is committed to weight loss.  Unfortunate his father's had a stroke and is now in skilled nursing and has increased his responsibilities.  He has had no angina dyspnea palpitation or syncope and tolerates his  statin without muscle pain or weakness  His concern is the need for follow-up ischemia evaluation at this time his CAD is stable asymptomatic on medical therapy and I told him in the future we could consider a functional test like a stress echocardiogram or follow-up CTA and we discuss at next visit.Marland Kitchen Past Medical History:  Diagnosis Date  . Annual physical exam 07/26/2014  . Chest pain 08/25/2017  . 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 DDD (degenerative disc disease), cervical 05/22/2018  . Exercise-induced bronchoconstriction 09/14/2019  . History of allergic reaction 03/21/2014   Formatting of this note might be different from the original. Bee sting  . History of right peroneus brevis tear post repair 07/26/2014  . Hyperlipidemia 07/26/2014  . Impingement syndrome of right shoulder 03/01/2019  . Lumbar facet arthropathy 02/23/2018  . Lumbar facet joint syndrome 04/11/2016  . Mild CAD 12/24/2017  . Obstructive uropathy 03/19/2016  . Osteoarthritis of both knees 07/26/2014   Postarthroscopy and partial meniscectomy on the right. Unloader brace on the right. Status post anterior cruciate ligament repair with reconstruction on the left.  Orthovisc approved   . Other and unspecified hyperlipidemia 03/21/2014   Formatting of this note might be different from the original. Diet controlled  . Pain    LEFT SHOULDER- ROTATOR CUFF  TEAR  . Primary osteoarthritis of both knees 07/26/2014   Postarthroscopy and partial meniscectomy on the right. Unloader brace on the right. Status post  anterior cruciate ligament repair with reconstruction on the left.  Orthovisc approved   . Primary osteoarthritis of first carpometacarpal joint of right hand 06/04/2016  . 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 Right foot pain 03/01/2019  . Rotator cuff tear, left 06/18/2012  . S/P ventriculoperitoneal shunt 07/26/2014  . Smoker 08/19/2017  . Stable angina (HCC) 08/19/2017  . Tinea pedis, left 05/22/2018  . 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 Yellow jacket sting allergy     Past Surgical History:  Procedure Laterality Date  . ANTERIOR CRUCIATE LIGAMENT REPAIR  1997 OR 1998   LEFT KNEE  . LEFT ANKLE TENDON TRANSFER  FEB 2010  . RIGHT ANKLE TENDON TRANSFER  SEPT 30, 2013   SURGERY [facility] DONE IN [facility]--PT WEARS  ORTHOTIC BOOT WHEN AMBULATING--HEALING INCISION--SOME WEAKNESS STILL  . RIGHT WRIST FIXATION AUG  2006    . SHOULDER ARTHROSCOPY WITH ROTATOR CUFF REPAIR AND SUBACROMIAL DECOMPRESSION  06/18/2012   Procedure: SHOULDER ARTHROSCOPY WITH ROTATOR CUFF REPAIR AND SUBACROMIAL DECOMPRESSION;  Surgeon: Javier Docker, MD;  Location: [facility];  Service: Orthopedics;  Laterality: Left;  Left Shoulder arthroscopy subacromial decompression mini open rotator cuff repair   . VENTRICULOPERITONEAL SHUNT      Current Medications: Current Meds  Medication Sig  . albuterol (VENTOLIN HFA) 108 (90 Base) MCG/ACT inhaler 2 puffs 15 minutes before physical activity  . ASPIRIN LOW DOSE 81 MG EC tablet TAKE 1 TABLET BY MOUTH EVERY DAY  . CHANTIX 1 MG tablet TAKE 1 TABLET BY MOUTH TWICE A DAY  . dutasteride (AVODART) 0.5 MG capsule Take 1 capsule (0.5 mg total) by mouth daily.  Marland Kitchen EPINEPHrine 0.3 mg/0.3 mL IJ SOAJ injection   . ezetimibe (ZETIA) 10 MG tablet TAKE 1 TABLET BY MOUTH DAILY GENERIC EQUIVALENT FOR ZETIA  . fish oil-omega-3 fatty acids 1000 MG capsule Take 1 g by mouth daily.  . meloxicam (MOBIC) 15 MG tablet Take 1 tablet (15 mg total) by mouth daily as needed for pain.  . metoprolol succinate (TOPROL-XL) 50 MG 24 hr tablet TAKE 1 TABLET BY MOUTH DAILY. TAKE WITH OR IMMEDIATELY FOLLOWING A MEAL  . Multiple Vitamin (MULTIVITAMIN WITH MINERALS) TABS Take 1 tablet by mouth daily.  . rosuvastatin (CRESTOR) 20 MG tablet TAKE 1 TABLET BY MOUTH EVERY DAY  . tadalafil (CIALIS) 10 MG tablet Take 1  tablet (10 mg total) by mouth daily.     Allergies:   Bee pollen and Bee venom   Social History   Socioeconomic History  . Marital status: Married    Spouse name: Not on file  . Number of children: Not on file  . Years of education: Not on file  . Highest education level: Not on file  Occupational History  . Not on file  Tobacco Use  . Smoking status: Former Smoker    Packs/day: 1.00    Years: 20.00    Pack years: 20.00    Types: Cigarettes  . Smokeless tobacco: Never Used  . Tobacco comment: QUIT SMOKING 2011, has restarted   Vaping Use  . Vaping Use: Never used  Substance and Sexual Activity  . Alcohol use: No  . Drug use: No  . Sexual activity: Not on file  Other Topics Concern  . Not on file  Social History Narrative  . Not on file   Social Determinants of Health  Financial Resource Strain:   . Difficulty of Paying Living Expenses: Not on file  Food Insecurity:   . Worried About Programme researcher, broadcasting/film/video in the Last Year: Not on file  . Ran Out of Food in the Last Year: Not on file  Transportation Needs:   . Lack of Transportation (Medical): Not on file  . Lack of Transportation (Non-Medical): Not on file  Physical Activity:   . Days of Exercise per Week: Not on file  . Minutes of Exercise per Session: Not on file  Stress:   . Feeling of Stress : Not on file  Social Connections:   . Frequency of Communication with Friends and Family: Not on file  . Frequency of Social Gatherings with Friends and Family: Not on file  . Attends Religious Services: Not on file  . Active Member of Clubs or Organizations: Not on file  . Attends Banker Meetings: Not on file  . Marital Status: Not on file     Family History: The patient's family history includes Arthritis in his father and sister; CAD in his father; Diabetes in his father; Hyperlipidemia in his father. ROS:   Please see the history of present illness.    All other systems reviewed and are  negative.  EKGs/Labs/Other Studies Reviewed:    The following studies were reviewed today:   Recent Labs: 11/17/2019: ALT 31; BUN 13; Creatinine, Ser 0.99; Potassium 4.7; Sodium 140  Recent Lipid Panel    Component Value Date/Time   CHOL 97 (L) 11/17/2019 1018   TRIG 151 (H) 11/17/2019 1018   HDL 42 11/17/2019 1018   CHOLHDL 2.3 11/17/2019 1018   CHOLHDL 3.4 08/19/2017 1629   VLDL 19 04/08/2016 0832   LDLCALC 30 11/17/2019 1018   LDLCALC 74 08/19/2017 1629    Physical Exam:    VS:  BP 100/68   Pulse 72   Ht 5\' 7"  (1.702 m)   Wt 189 lb (85.7 kg)   SpO2 97%   BMI 29.60 kg/m     Wt Readings from Last 3 Encounters:  05/30/20 189 lb (85.7 kg)  11/17/19 189 lb 0.6 oz (85.7 kg)  09/14/19 190 lb (86.2 kg)     GEN:  Well nourished, well developed in no acute distress HEENT: Normal NECK: No JVD; No carotid bruits LYMPHATICS: No lymphadenopathy CARDIAC: RRR, no murmurs, rubs, gallops RESPIRATORY:  Clear to auscultation without rales, wheezing or rhonchi  ABDOMEN: Soft, non-tender, non-distended MUSCULOSKELETAL:  No edema; No deformity  SKIN: Warm and dry NEUROLOGIC:  Alert and oriented x 3 PSYCHIATRIC:  Normal affect    Signed, 11/14/19, MD  05/30/2020 9:14 AM    [facility]

## 2020-05-30 ENCOUNTER — Other Ambulatory Visit: Payer: Self-pay

## 2020-05-30 ENCOUNTER — Ambulatory Visit: Payer: BC Managed Care – PPO | Admitting: Cardiology

## 2020-05-30 ENCOUNTER — Encounter: Payer: Self-pay | Admitting: Cardiology

## 2020-05-30 VITALS — BP 100/68 | HR 72 | Ht 67.0 in | Wt 189.0 lb

## 2020-05-30 DIAGNOSIS — R635 Abnormal weight gain: Secondary | ICD-10-CM | POA: Diagnosis not present

## 2020-05-30 DIAGNOSIS — E785 Hyperlipidemia, unspecified: Secondary | ICD-10-CM

## 2020-05-30 DIAGNOSIS — I2510 Atherosclerotic heart disease of native coronary artery without angina pectoris: Secondary | ICD-10-CM | POA: Diagnosis not present

## 2020-05-30 NOTE — Addendum Note (Signed)
Addended by: Delorse Limber I on: 05/30/2020 09:19 AM   Modules accepted: Orders

## 2020-05-30 NOTE — Patient Instructions (Signed)

## 2020-05-31 ENCOUNTER — Telehealth: Payer: Self-pay

## 2020-05-31 LAB — LIPID PANEL
Chol/HDL Ratio: 2.6 ratio (ref 0.0–5.0)
Cholesterol, Total: 111 mg/dL (ref 100–199)
HDL: 43 mg/dL (ref >39)
LDL Chol Calc (NIH): 50 mg/dL (ref 0–99)
Triglycerides: 97 mg/dL (ref 0–149)
VLDL Cholesterol Cal: 18 mg/dL (ref 5–40)

## 2020-05-31 LAB — COMPREHENSIVE METABOLIC PANEL
ALT: 32 IU/L (ref 0–44)
AST: 32 IU/L (ref 0–40)
Albumin/Globulin Ratio: 2.2 (ref 1.2–2.2)
Albumin: 4.7 g/dL (ref 4.0–5.0)
Alkaline Phosphatase: 63 IU/L (ref 44–121)
BUN/Creatinine Ratio: 15 (ref 9–20)
BUN: 15 mg/dL (ref 6–24)
Bilirubin Total: 0.5 mg/dL (ref 0.0–1.2)
CO2: 25 mmol/L (ref 20–29)
Calcium: 9.9 mg/dL (ref 8.7–10.2)
Chloride: 102 mmol/L (ref 96–106)
Creatinine, Ser: 0.97 mg/dL (ref 0.76–1.27)
GFR calc Af Amer: 106 mL/min/{1.73_m2} (ref >59)
GFR calc non Af Amer: 91 mL/min/{1.73_m2} (ref >59)
Globulin, Total: 2.1 g/dL (ref 1.5–4.5)
Glucose: 101 mg/dL — ABNORMAL HIGH (ref 65–99)
Potassium: 5.1 mmol/L (ref 3.5–5.2)
Sodium: 142 mmol/L (ref 134–144)
Total Protein: 6.8 g/dL (ref 6.0–8.5)

## 2020-05-31 NOTE — Telephone Encounter (Signed)
-----   Message from Brian J Munley, MD sent at 05/31/2020  7:44 AM EST ----- Good result no change 

## 2020-05-31 NOTE — Telephone Encounter (Signed)
Spoke with patient regarding results and recommendation.  Patient verbalizes understanding and is agreeable to plan of care. Advised patient to call back with any issues or concerns.  

## 2020-06-10 ENCOUNTER — Other Ambulatory Visit: Payer: Self-pay | Admitting: Sports Medicine

## 2020-06-10 DIAGNOSIS — I208 Other forms of angina pectoris: Secondary | ICD-10-CM

## 2020-06-12 ENCOUNTER — Ambulatory Visit: Payer: BC Managed Care – PPO | Admitting: Sports Medicine

## 2020-06-26 DIAGNOSIS — L570 Actinic keratosis: Secondary | ICD-10-CM | POA: Diagnosis not present

## 2020-06-26 DIAGNOSIS — L814 Other melanin hyperpigmentation: Secondary | ICD-10-CM | POA: Diagnosis not present

## 2020-06-26 DIAGNOSIS — X32XXXS Exposure to sunlight, sequela: Secondary | ICD-10-CM | POA: Diagnosis not present

## 2020-06-26 DIAGNOSIS — L578 Other skin changes due to chronic exposure to nonionizing radiation: Secondary | ICD-10-CM | POA: Diagnosis not present

## 2020-07-11 ENCOUNTER — Telehealth: Payer: Self-pay | Admitting: Sports Medicine

## 2020-07-19 NOTE — Telephone Encounter (Signed)
Received VM from [facility] stating that they have been unable to reach the patient by phone to get permission to ship Synvisc to our office. They are placing this on hold and sending patient a letter.

## 2020-07-28 ENCOUNTER — Telehealth: Payer: Self-pay

## 2020-07-28 NOTE — Telephone Encounter (Signed)
PA approval for Synvisc 16 mg/34ml from 07/05/20-01/02/21. REF #[id number].

## 2020-08-03 DIAGNOSIS — H524 Presbyopia: Secondary | ICD-10-CM | POA: Diagnosis not present

## 2020-08-31 ENCOUNTER — Telehealth: Payer: Self-pay | Admitting: Sports Medicine

## 2020-08-31 DIAGNOSIS — M170 Bilateral primary osteoarthritis of knee: Secondary | ICD-10-CM

## 2020-08-31 MED ORDER — SYNVISC 16 MG/2ML IX SOSY: PREFILLED_SYRINGE | INTRA_ARTICULAR | 3 refills | Status: DC

## 2020-08-31 NOTE — Telephone Encounter (Signed)
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

## 2020-08-31 NOTE — Telephone Encounter (Signed)
Thank you, Synvisc sent to the specialty pharmacy.

## 2020-09-08 DIAGNOSIS — M9901 Segmental and somatic dysfunction of cervical region: Secondary | ICD-10-CM | POA: Diagnosis not present

## 2020-09-08 DIAGNOSIS — M530 Cervicocranial syndrome: Secondary | ICD-10-CM | POA: Diagnosis not present

## 2020-09-08 DIAGNOSIS — M5451 Vertebrogenic low back pain: Secondary | ICD-10-CM | POA: Diagnosis not present

## 2020-09-08 DIAGNOSIS — M9903 Segmental and somatic dysfunction of lumbar region: Secondary | ICD-10-CM | POA: Diagnosis not present

## 2020-09-10 ENCOUNTER — Other Ambulatory Visit: Payer: Self-pay | Admitting: Sports Medicine

## 2020-09-10 DIAGNOSIS — F17200 Nicotine dependence, unspecified, uncomplicated: Secondary | ICD-10-CM

## 2020-09-12 DIAGNOSIS — M530 Cervicocranial syndrome: Secondary | ICD-10-CM | POA: Diagnosis not present

## 2020-09-12 DIAGNOSIS — M5451 Vertebrogenic low back pain: Secondary | ICD-10-CM | POA: Diagnosis not present

## 2020-09-12 DIAGNOSIS — M9903 Segmental and somatic dysfunction of lumbar region: Secondary | ICD-10-CM | POA: Diagnosis not present

## 2020-09-12 DIAGNOSIS — M9901 Segmental and somatic dysfunction of cervical region: Secondary | ICD-10-CM | POA: Diagnosis not present

## 2020-09-13 ENCOUNTER — Other Ambulatory Visit: Payer: Self-pay

## 2020-09-13 DIAGNOSIS — E785 Hyperlipidemia, unspecified: Secondary | ICD-10-CM

## 2020-09-13 DIAGNOSIS — I25118 Atherosclerotic heart disease of native coronary artery with other forms of angina pectoris: Secondary | ICD-10-CM

## 2020-09-13 MED ORDER — EZETIMIBE 10 MG PO TABS: ORAL_TABLET | ORAL | 1 refills | Status: DC

## 2020-09-13 NOTE — Telephone Encounter (Signed)
Ezetimibe approved and sent

## 2020-09-14 ENCOUNTER — Telehealth: Payer: Self-pay

## 2020-09-14 NOTE — Telephone Encounter (Signed)
PA on CMM for Zetia 10 mg. Key [id number]

## 2020-09-14 NOTE — Telephone Encounter (Signed)
PA approved for Zetia 10 mg from 09-14-20 until 09-14-21

## 2020-09-18 DIAGNOSIS — M170 Bilateral primary osteoarthritis of knee: Secondary | ICD-10-CM

## 2020-09-19 DIAGNOSIS — M530 Cervicocranial syndrome: Secondary | ICD-10-CM | POA: Diagnosis not present

## 2020-09-19 DIAGNOSIS — M9901 Segmental and somatic dysfunction of cervical region: Secondary | ICD-10-CM | POA: Diagnosis not present

## 2020-09-19 DIAGNOSIS — M9903 Segmental and somatic dysfunction of lumbar region: Secondary | ICD-10-CM | POA: Diagnosis not present

## 2020-09-19 DIAGNOSIS — M5451 Vertebrogenic low back pain: Secondary | ICD-10-CM | POA: Diagnosis not present

## 2020-09-26 DIAGNOSIS — M530 Cervicocranial syndrome: Secondary | ICD-10-CM | POA: Diagnosis not present

## 2020-09-26 DIAGNOSIS — M5451 Vertebrogenic low back pain: Secondary | ICD-10-CM | POA: Diagnosis not present

## 2020-09-26 DIAGNOSIS — M9901 Segmental and somatic dysfunction of cervical region: Secondary | ICD-10-CM | POA: Diagnosis not present

## 2020-09-26 DIAGNOSIS — M9903 Segmental and somatic dysfunction of lumbar region: Secondary | ICD-10-CM | POA: Diagnosis not present

## 2020-09-28 ENCOUNTER — Telehealth: Payer: Self-pay

## 2020-09-28 NOTE — Telephone Encounter (Signed)
What the shit? Just when it looked like he was going to get it with patient assistance (based on prior phone notes).  Arline Asp, Hospital doctor any ideas?

## 2020-09-28 NOTE — Telephone Encounter (Signed)
[facility] left a VM that the Synvisc was not covered under patient's medical insurance and wasn't considered medically necessary. Therefore, they are unable to fill at this time.

## 2020-09-29 NOTE — Telephone Encounter (Signed)
Dr T    I received a Pre determination from his insurance I have to call them to do it so I don't know why they are already assuming it is denied. I am calling this afternoon and then we will have an answer. - [facility]

## 2020-10-02 NOTE — Telephone Encounter (Signed)
I called patient's insurance and they stated no Frederick Ward is required. After speaking with [name] she said the pharmacy is the one stating the medication is not medically necessary. I am not sure what is going on with this one but according to patient's insurance it does not required an authorization. - [facility]

## 2020-10-02 NOTE — Telephone Encounter (Signed)
I wonder if Thelmer just needs to call his pharmacy and authorize them sending [place] the syringes.

## 2020-10-03 DIAGNOSIS — M530 Cervicocranial syndrome: Secondary | ICD-10-CM | POA: Diagnosis not present

## 2020-10-03 DIAGNOSIS — M5451 Vertebrogenic low back pain: Secondary | ICD-10-CM | POA: Diagnosis not present

## 2020-10-03 DIAGNOSIS — M9903 Segmental and somatic dysfunction of lumbar region: Secondary | ICD-10-CM | POA: Diagnosis not present

## 2020-10-03 DIAGNOSIS — M9901 Segmental and somatic dysfunction of cervical region: Secondary | ICD-10-CM | POA: Diagnosis not present

## 2020-10-06 MED ORDER — SYNVISC 16 MG/2ML IX SOSY: PREFILLED_SYRINGE | INTRA_ARTICULAR | 3 refills | Status: DC

## 2020-10-10 DIAGNOSIS — M530 Cervicocranial syndrome: Secondary | ICD-10-CM | POA: Diagnosis not present

## 2020-10-10 DIAGNOSIS — M5451 Vertebrogenic low back pain: Secondary | ICD-10-CM | POA: Diagnosis not present

## 2020-10-10 DIAGNOSIS — M9903 Segmental and somatic dysfunction of lumbar region: Secondary | ICD-10-CM | POA: Diagnosis not present

## 2020-10-10 DIAGNOSIS — M9901 Segmental and somatic dysfunction of cervical region: Secondary | ICD-10-CM | POA: Diagnosis not present

## 2020-10-17 DIAGNOSIS — M5451 Vertebrogenic low back pain: Secondary | ICD-10-CM | POA: Diagnosis not present

## 2020-10-17 DIAGNOSIS — M530 Cervicocranial syndrome: Secondary | ICD-10-CM | POA: Diagnosis not present

## 2020-10-17 DIAGNOSIS — M9901 Segmental and somatic dysfunction of cervical region: Secondary | ICD-10-CM | POA: Diagnosis not present

## 2020-10-17 DIAGNOSIS — M9903 Segmental and somatic dysfunction of lumbar region: Secondary | ICD-10-CM | POA: Diagnosis not present

## 2020-11-07 DIAGNOSIS — M9903 Segmental and somatic dysfunction of lumbar region: Secondary | ICD-10-CM | POA: Diagnosis not present

## 2020-11-07 DIAGNOSIS — M9901 Segmental and somatic dysfunction of cervical region: Secondary | ICD-10-CM | POA: Diagnosis not present

## 2020-11-07 DIAGNOSIS — M5451 Vertebrogenic low back pain: Secondary | ICD-10-CM | POA: Diagnosis not present

## 2020-11-07 DIAGNOSIS — M530 Cervicocranial syndrome: Secondary | ICD-10-CM | POA: Diagnosis not present

## 2020-11-17 ENCOUNTER — Other Ambulatory Visit: Payer: Self-pay | Admitting: Cardiology

## 2020-11-17 DIAGNOSIS — M9903 Segmental and somatic dysfunction of lumbar region: Secondary | ICD-10-CM | POA: Diagnosis not present

## 2020-11-17 DIAGNOSIS — M9901 Segmental and somatic dysfunction of cervical region: Secondary | ICD-10-CM | POA: Diagnosis not present

## 2020-11-17 DIAGNOSIS — M5451 Vertebrogenic low back pain: Secondary | ICD-10-CM | POA: Diagnosis not present

## 2020-11-17 DIAGNOSIS — M530 Cervicocranial syndrome: Secondary | ICD-10-CM | POA: Diagnosis not present

## 2020-11-17 NOTE — Telephone Encounter (Signed)
Rosuvastatin 20 mg # 90 x 1 refill sent to pharmacy

## 2020-11-24 ENCOUNTER — Other Ambulatory Visit: Payer: Self-pay | Admitting: Cardiology

## 2020-11-24 DIAGNOSIS — I25118 Atherosclerotic heart disease of native coronary artery with other forms of angina pectoris: Secondary | ICD-10-CM

## 2020-11-24 DIAGNOSIS — E785 Hyperlipidemia, unspecified: Secondary | ICD-10-CM

## 2020-12-13 ENCOUNTER — Other Ambulatory Visit: Payer: Self-pay | Admitting: Sports Medicine

## 2020-12-20 NOTE — Progress Notes (Signed)
Cardiology Office Note:    Date:  12/21/2020   ID:  Frederick Ward, DOB 04/11/1971, MRN [id number]  PCP:  Monica Becton, MD  Cardiologist:  Norman Herrlich, MD    Referring MD: Monica Becton,*    ASSESSMENT:    1. Mild CAD   2. Hyperlipidemia, unspecified hyperlipidemia type    PLAN:    In order of problems listed above:  Stable continue medical therapy aspirin beta-blocker high intensity statin and plan stress echocardiogram after discussion with the patient and his concerns about cardiac prognosis Continue his statin check CMP lipid profile   Next appointment: 1 year   Medication Adjustments/Labs and Tests Ordered: Current medicines are reviewed at length with the patient today.  Concerns regarding medicines are outlined above.  No orders of the defined types were placed in this encounter.  No orders of the defined types were placed in this encounter.   Chief Complaint  Patient presents with   Follow-up    History of Present Illness:    Frederick Ward is a 50 y.o. male with a hx of mild CAD and hyperlipidemia last seen 05/20/2020.  His cardiac CTA 08/25/2017 showed a calcium score of 274 90th percentile and 50% or less coronary stenosis with normal flow fractional reserve. Compliance with diet, lifestyle and medications: Yes  Doing well no angina dyspnea palpitations syncope tolerates his statin without muscle pain or weakness and is lost 6 pounds. He remains concerned about his prognosis we discussed doing a repeat ischemia evaluation we will plan on doing a stress echocardiogram.  If he had high risk markers and favor coronary angiography. 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 Bee sting   History of right peroneus brevis tear post repair 07/26/2014   Hyperlipidemia 07/26/2014   Impingement syndrome of right shoulder 03/01/2019   Lumbar facet arthropathy 02/23/2018   Lumbar facet joint syndrome 04/11/2016   Mild CAD 12/24/2017   Obstructive uropathy 03/19/2016   Osteoarthritis of both knees 07/26/2014   Postarthroscopy and partial meniscectomy on the right. Unloader brace on the right. Status post anterior cruciate ligament repair with reconstruction on the left.  Orthovisc approved    Other and unspecified hyperlipidemia 03/21/2014   Formatting of this note might be different from the original. Diet controlled   Pain    LEFT SHOULDER- ROTATOR CUFF  TEAR   Primary osteoarthritis of both knees 07/26/2014   Postarthroscopy and partial meniscectomy on the right. Unloader brace on the right. Status post anterior cruciate ligament repair with reconstruction on the left.  Orthovisc approved    Primary osteoarthritis of first carpometacarpal joint of right hand 06/04/2016   Primary osteoarthritis of right foot 08/16/2016   Right foot pain 03/01/2019   Rotator cuff tear, left 06/18/2012   S/P ventriculoperitoneal shunt 07/26/2014   Smoker 08/19/2017   Stable angina (HCC) 08/19/2017   Tinea pedis, left 05/22/2018   VP (ventriculoperitoneal) shunt status 2009   FOR OBSTRUCTIVE HYDROCEHALUS - CAUSED BY COLLOID BRAIN CYST ( CYST NOT OPERTIVE )   Yellow jacket sting allergy     Past Surgical History:  Procedure Laterality Date   ANTERIOR CRUCIATE LIGAMENT REPAIR  1997 OR 1998   LEFT KNEE  LEFT ANKLE TENDON TRANSFER  FEB 2010   RIGHT ANKLE TENDON TRANSFER  SEPT 30, 2013   SURGERY WAS DONE IN [facility]--PT WEARS  ORTHOTIC BOOT WHEN AMBULATING--HEALING INCISION--SOME WEAKNESS STILL   RIGHT WRIST FIXATION AUG  2006     SHOULDER ARTHROSCOPY WITH ROTATOR CUFF REPAIR AND SUBACROMIAL DECOMPRESSION  06/18/2012   Procedure: SHOULDER ARTHROSCOPY WITH  ROTATOR CUFF REPAIR AND SUBACROMIAL DECOMPRESSION;  Surgeon: Javier Docker, MD;  Location: [facility];  Service: Orthopedics;  Laterality: Left;  Left Shoulder arthroscopy subacromial decompression mini open rotator cuff repair    VENTRICULOPERITONEAL SHUNT      Current Medications: Current Meds  Medication Sig   albuterol (VENTOLIN HFA) 108 (90 Base) MCG/ACT inhaler 2 puffs 15 minutes before physical activity   aspirin 81 MG chewable tablet Chew 81 mg by mouth daily.   dutasteride (AVODART) 0.5 MG capsule TAKE 1 CAPSULE BY MOUTH EVERY DAY   EPINEPHrine 0.3 mg/0.3 mL IJ SOAJ injection    ezetimibe (ZETIA) 10 MG tablet TAKE 1 TABLET BY MOUTH DAILY GENERIC EQUIVALENT FOR ZETIA   fish oil-omega-3 fatty acids 1000 MG capsule Take 1 g by mouth daily.   meloxicam (MOBIC) 15 MG tablet TAKE 1 TABLET BY MOUTH EVERY DAY AS NEEDED FOR PAIN   metoprolol succinate (TOPROL-XL) 50 MG 24 hr tablet TAKE 1 TABLET BY MOUTH DAILY . TAKE WITH OR IMMEDIATELY FOLLOWING A MEAL   Multiple Vitamin (MULTIVITAMIN WITH MINERALS) TABS Take 1 tablet by mouth daily.   rosuvastatin (CRESTOR) 20 MG tablet TAKE 1 TABLET BY MOUTH EVERY DAY   tadalafil (CIALIS) 10 MG tablet Take 1 tablet (10 mg total) by mouth daily.   varenicline (CHANTIX) 1 MG tablet TAKE 1 TABLET BY MOUTH TWICE A DAY     Allergies:   Bee pollen and Bee venom   Social History   Socioeconomic History   Marital status: Married    Spouse name: Not on file   Number of children: Not on file   Years of education: Not on file   Highest education level: Not on file  Occupational History   Not on file  Tobacco Use   Smoking status: Former    Packs/day: 1.00    Years: 20.00    Pack years: 20.00    Types: Cigarettes   Smokeless tobacco: Never   Tobacco comments:    QUIT SMOKING 2011, has restarted   Vaping Use   Vaping Use: Never used  Substance and Sexual Activity   Alcohol use: No   Drug use: No   Sexual activity: Not on file  Other Topics Concern    Not on file  Social History Narrative   Not on file   Social Determinants of Health   Financial Resource Strain: Not on file  Food Insecurity: Not on file  Transportation Needs: Not on file  Physical Activity: Not on file  Stress: Not on file  Social Connections: Not on file     Family History: The patient's family history includes Arthritis in his father and sister; CAD in his father; Diabetes in his father; Hyperlipidemia in his father. ROS:   Please see the history of present illness.    All other systems reviewed and are negative.  EKGs/Labs/Other Studies Reviewed:    The following studies were reviewed today:  EKG:  EKG ordered today and personally reviewed.  The ekg ordered today demonstrates sinus rhythm and is normal  Recent Labs: 05/30/2020: ALT 32; BUN 15; Creatinine,  Ser 0.97; Potassium 5.1; Sodium 142  Recent Lipid Panel    Component Value Date/Time   CHOL 111 05/30/2020 0917   TRIG 97 05/30/2020 0917   HDL 43 05/30/2020 0917   CHOLHDL 2.6 05/30/2020 0917   CHOLHDL 3.4 08/19/2017 1629   VLDL 19 04/08/2016 0832   LDLCALC 50 05/30/2020 0917   LDLCALC 74 08/19/2017 1629    Physical Exam:    VS:  BP 110/72 (BP Location: Left Arm, Patient Position: Sitting, Cuff Size: Normal)   Ht 5\' 7"  (1.702 m)   Wt 183 lb (83 kg)   SpO2 94%   BMI 28.66 kg/m     Wt Readings from Last 3 Encounters:  12/21/20 183 lb (83 kg)  05/30/20 189 lb (85.7 kg)  11/17/19 189 lb 0.6 oz (85.7 kg)     GEN:  Well nourished, well developed in no acute distress HEENT: Normal NECK: No JVD; No carotid bruits LYMPHATICS: No lymphadenopathy CARDIAC: RRR, no murmurs, rubs, gallops RESPIRATORY:  Clear to auscultation without rales, wheezing or rhonchi  ABDOMEN: Soft, non-tender, non-distended MUSCULOSKELETAL:  No edema; No deformity  SKIN: Warm and dry NEUROLOGIC:  Alert and oriented x 3 PSYCHIATRIC:  Normal affect    Signed, 01/17/20, MD  12/21/2020 8:15 AM    [facility]

## 2020-12-21 ENCOUNTER — Other Ambulatory Visit: Payer: Self-pay

## 2020-12-21 ENCOUNTER — Ambulatory Visit (INDEPENDENT_AMBULATORY_CARE_PROVIDER_SITE_OTHER): Payer: BC Managed Care – PPO | Admitting: Cardiology

## 2020-12-21 ENCOUNTER — Encounter: Payer: Self-pay | Admitting: Cardiology

## 2020-12-21 VITALS — BP 110/72 | HR 66 | Ht 67.0 in | Wt 183.0 lb

## 2020-12-21 DIAGNOSIS — I2510 Atherosclerotic heart disease of native coronary artery without angina pectoris: Secondary | ICD-10-CM | POA: Diagnosis not present

## 2020-12-21 DIAGNOSIS — E785 Hyperlipidemia, unspecified: Secondary | ICD-10-CM | POA: Diagnosis not present

## 2020-12-21 NOTE — Addendum Note (Signed)
Addended by: Delorse Limber I on: 12/21/2020 08:33 AM   Modules accepted: Orders

## 2020-12-21 NOTE — Patient Instructions (Signed)
Medication Instructions:  Your physician recommends that you continue on your current medications as directed. Please refer to the Current Medication list given to you today.  *If you need a refill on your cardiac medications before your next appointment, please call your pharmacy*   Lab Work: Your physician recommends that you return for lab work in: TODAY CMP, Lipids If you have labs (blood work) drawn today and your tests are completely normal, you will receive your results only by: MyChart Message (if you have MyChart) OR A paper copy in the mail If you have any lab test that is abnormal or we need to change your treatment, we will call you to review the results.   Testing/Procedures: Your physician has requested that you have a stress echocardiogram. For further information please visit [email or web address] Please follow instruction sheet as given.    [facility] at [facility] [address], [address] [place], [place] [address] Phone: [phone]    Please arrive 15 minutes prior to your appointment time for registration and insurance purposes.  The test will take approximately 3 to 4 hours to complete; you may bring reading material.  If someone comes with you to your appointment, they will need to remain in the main lobby due to limited space in the testing area. **If you are pregnant or breastfeeding, please notify the nuclear lab prior to your appointment**  How to prepare for your Myocardial Perfusion Test: Do not eat or drink 3 hours prior to your test, except you may have water. Do not consume products containing caffeine (regular or decaffeinated) 12 hours prior to your test. (ex: coffee, chocolate, sodas, tea). Do bring a list of your current medications with you.  If not listed below, you may take your medications as normal. HOLD Erectile dysfunction medication: Cialis  Do wear comfortable clothes (no dresses or overalls) and walking  shoes, tennis shoes preferred (No heels or open toe shoes are allowed). Do NOT wear cologne, perfume, aftershave, or lotions (deodorant is allowed). If these instructions are not followed, your test will have to be rescheduled.  Please report to [address] for your test.  If you have questions or concerns about your appointment, you can call the Nuclear Lab at [phone].  If you cannot keep your appointment, please provide 24 hours notification to the Nuclear Lab, to avoid a possible $50 charge to your account.    Follow-Up: At [facility], you and your health needs are our priority.  As part of our continuing mission to provide you with exceptional heart care, we have created designated Provider Care Teams.  These Care Teams include your primary Cardiologist (physician) and Advanced Practice Providers (APPs -  Physician Assistants and Nurse Practitioners) who all work together to provide you with the care you need, when you need it.  We recommend signing up for the patient portal called "MyChart".  Sign up information is provided on this After Visit Summary.  MyChart is used to connect with patients for Virtual Visits (Telemedicine).  Patients are able to view lab/test results, encounter notes, upcoming appointments, etc.  Non-urgent messages can be sent to your provider as well.   To learn more about what you can do with MyChart, go to [email or web address].    Your next appointment:   1 year(s)  The format for your next appointment:   In Person  Provider:   Norman Herrlich, MD   Other Instructions

## 2020-12-21 NOTE — Addendum Note (Signed)
Addended by: Roddie Mc on: 12/21/2020 08:36 AM   Modules accepted: Orders

## 2020-12-22 ENCOUNTER — Telehealth: Payer: Self-pay

## 2020-12-22 DIAGNOSIS — M5451 Vertebrogenic low back pain: Secondary | ICD-10-CM | POA: Diagnosis not present

## 2020-12-22 DIAGNOSIS — M9901 Segmental and somatic dysfunction of cervical region: Secondary | ICD-10-CM | POA: Diagnosis not present

## 2020-12-22 DIAGNOSIS — M9903 Segmental and somatic dysfunction of lumbar region: Secondary | ICD-10-CM | POA: Diagnosis not present

## 2020-12-22 DIAGNOSIS — M530 Cervicocranial syndrome: Secondary | ICD-10-CM | POA: Diagnosis not present

## 2020-12-22 LAB — LIPID PANEL
Chol/HDL Ratio: 2.3 ratio (ref 0.0–5.0)
Cholesterol, Total: 87 mg/dL — ABNORMAL LOW (ref 100–199)
HDL: 38 mg/dL — ABNORMAL LOW (ref >39)
LDL Chol Calc (NIH): 35 mg/dL (ref 0–99)
Triglycerides: 61 mg/dL (ref 0–149)
VLDL Cholesterol Cal: 14 mg/dL (ref 5–40)

## 2020-12-22 LAB — COMPREHENSIVE METABOLIC PANEL
ALT: 38 IU/L (ref 0–44)
AST: 36 IU/L (ref 0–40)
Albumin/Globulin Ratio: 2.5 — ABNORMAL HIGH (ref 1.2–2.2)
Albumin: 4.8 g/dL (ref 4.0–5.0)
Alkaline Phosphatase: 66 IU/L (ref 44–121)
BUN/Creatinine Ratio: 20 (ref 9–20)
BUN: 17 mg/dL (ref 6–24)
Bilirubin Total: 0.3 mg/dL (ref 0.0–1.2)
CO2: 24 mmol/L (ref 20–29)
Calcium: 9.5 mg/dL (ref 8.7–10.2)
Chloride: 102 mmol/L (ref 96–106)
Creatinine, Ser: 0.87 mg/dL (ref 0.76–1.27)
Globulin, Total: 1.9 g/dL (ref 1.5–4.5)
Glucose: 102 mg/dL — ABNORMAL HIGH (ref 65–99)
Potassium: 4.4 mmol/L (ref 3.5–5.2)
Sodium: 139 mmol/L (ref 134–144)
Total Protein: 6.7 g/dL (ref 6.0–8.5)
eGFR: 106 mL/min/{1.73_m2} (ref >59)

## 2020-12-22 NOTE — Telephone Encounter (Signed)
Patient is returning call.  °

## 2020-12-22 NOTE — Telephone Encounter (Signed)
-----   Message from Baldo Daub, MD sent at 12/22/2020  7:39 AM EDT ----- Normal or stable result  No changes

## 2020-12-22 NOTE — Telephone Encounter (Signed)
Spoke with patient regarding results and recommendation.  Patient verbalizes understanding and is agreeable to plan of care. Advised patient to call back with any issues or concerns.  

## 2020-12-22 NOTE — Telephone Encounter (Signed)
Left message on patients voicemail to please return our call.   

## 2020-12-26 NOTE — Addendum Note (Signed)
Addended by: Norman Herrlich on: 12/26/2020 12:24 PM   Modules accepted: Orders

## 2021-01-05 DIAGNOSIS — M9903 Segmental and somatic dysfunction of lumbar region: Secondary | ICD-10-CM | POA: Diagnosis not present

## 2021-01-05 DIAGNOSIS — M530 Cervicocranial syndrome: Secondary | ICD-10-CM | POA: Diagnosis not present

## 2021-01-05 DIAGNOSIS — M9901 Segmental and somatic dysfunction of cervical region: Secondary | ICD-10-CM | POA: Diagnosis not present

## 2021-01-05 DIAGNOSIS — M5451 Vertebrogenic low back pain: Secondary | ICD-10-CM | POA: Diagnosis not present

## 2021-01-14 ENCOUNTER — Other Ambulatory Visit: Payer: Self-pay | Admitting: Sports Medicine

## 2021-01-14 DIAGNOSIS — F17200 Nicotine dependence, unspecified, uncomplicated: Secondary | ICD-10-CM

## 2021-01-26 DIAGNOSIS — M530 Cervicocranial syndrome: Secondary | ICD-10-CM | POA: Diagnosis not present

## 2021-01-26 DIAGNOSIS — M5451 Vertebrogenic low back pain: Secondary | ICD-10-CM | POA: Diagnosis not present

## 2021-01-26 DIAGNOSIS — M9901 Segmental and somatic dysfunction of cervical region: Secondary | ICD-10-CM | POA: Diagnosis not present

## 2021-01-26 DIAGNOSIS — M9903 Segmental and somatic dysfunction of lumbar region: Secondary | ICD-10-CM | POA: Diagnosis not present

## 2021-02-01 ENCOUNTER — Encounter (HOSPITAL_COMMUNITY): Payer: Self-pay | Admitting: *Deleted

## 2021-02-01 ENCOUNTER — Telehealth (HOSPITAL_COMMUNITY): Payer: Self-pay | Admitting: *Deleted

## 2021-02-01 NOTE — Telephone Encounter (Signed)
Letter sent via My chart with instructions for upcoming Stress Echo.  Frederick Ward

## 2021-02-01 NOTE — Telephone Encounter (Signed)
Patient called to say he hasnt received that documentation through my chart yet. Please advise

## 2021-02-07 ENCOUNTER — Ambulatory Visit (HOSPITAL_COMMUNITY): Payer: BC Managed Care – PPO

## 2021-02-07 ENCOUNTER — Telehealth: Payer: Self-pay

## 2021-02-07 ENCOUNTER — Other Ambulatory Visit: Payer: Self-pay

## 2021-02-07 ENCOUNTER — Ambulatory Visit (HOSPITAL_COMMUNITY): Payer: BC Managed Care – PPO | Attending: Internal Medicine

## 2021-02-07 DIAGNOSIS — E785 Hyperlipidemia, unspecified: Secondary | ICD-10-CM | POA: Insufficient documentation

## 2021-02-07 DIAGNOSIS — I2510 Atherosclerotic heart disease of native coronary artery without angina pectoris: Secondary | ICD-10-CM

## 2021-02-07 MED ORDER — PERFLUTREN LIPID MICROSPHERE
1.0000 mL | INTRAVENOUS | Status: AC | PRN
  Administered 2021-02-07: 1 mL via INTRAVENOUS
  Administered 2021-02-07 (×2): 2 mL via INTRAVENOUS
  Administered 2021-02-07: 3 mL via INTRAVENOUS

## 2021-02-07 NOTE — Telephone Encounter (Signed)
-----   Message from Baldo Daub, MD sent at 02/07/2021  3:44 PM EDT ----- Normal or stable result  And stress echocardiogram is a good no findings of inadequate blood flow this is a good result

## 2021-02-07 NOTE — Telephone Encounter (Signed)
Spoke with patient regarding results and recommendation.  Patient verbalizes understanding and is agreeable to plan of care. Advised patient to call back with any issues or concerns.  

## 2021-02-16 DIAGNOSIS — M5451 Vertebrogenic low back pain: Secondary | ICD-10-CM | POA: Diagnosis not present

## 2021-02-16 DIAGNOSIS — M530 Cervicocranial syndrome: Secondary | ICD-10-CM | POA: Diagnosis not present

## 2021-02-16 DIAGNOSIS — M9901 Segmental and somatic dysfunction of cervical region: Secondary | ICD-10-CM | POA: Diagnosis not present

## 2021-02-16 DIAGNOSIS — M9903 Segmental and somatic dysfunction of lumbar region: Secondary | ICD-10-CM | POA: Diagnosis not present

## 2021-02-21 ENCOUNTER — Other Ambulatory Visit: Payer: Self-pay | Admitting: Sports Medicine

## 2021-03-05 DIAGNOSIS — M9907 Segmental and somatic dysfunction of upper extremity: Secondary | ICD-10-CM | POA: Diagnosis not present

## 2021-03-05 DIAGNOSIS — M9903 Segmental and somatic dysfunction of lumbar region: Secondary | ICD-10-CM | POA: Diagnosis not present

## 2021-03-05 DIAGNOSIS — M9901 Segmental and somatic dysfunction of cervical region: Secondary | ICD-10-CM | POA: Diagnosis not present

## 2021-03-05 DIAGNOSIS — M5451 Vertebrogenic low back pain: Secondary | ICD-10-CM | POA: Diagnosis not present

## 2021-03-05 DIAGNOSIS — M530 Cervicocranial syndrome: Secondary | ICD-10-CM | POA: Diagnosis not present

## 2021-03-05 DIAGNOSIS — M9904 Segmental and somatic dysfunction of sacral region: Secondary | ICD-10-CM | POA: Diagnosis not present

## 2021-03-05 DIAGNOSIS — M9905 Segmental and somatic dysfunction of pelvic region: Secondary | ICD-10-CM | POA: Diagnosis not present

## 2021-03-08 ENCOUNTER — Ambulatory Visit (INDEPENDENT_AMBULATORY_CARE_PROVIDER_SITE_OTHER): Payer: Self-pay | Admitting: Sports Medicine

## 2021-03-08 ENCOUNTER — Other Ambulatory Visit: Payer: Self-pay

## 2021-03-08 ENCOUNTER — Ambulatory Visit (INDEPENDENT_AMBULATORY_CARE_PROVIDER_SITE_OTHER): Payer: BC Managed Care – PPO

## 2021-03-08 DIAGNOSIS — M170 Bilateral primary osteoarthritis of knee: Secondary | ICD-10-CM | POA: Diagnosis not present

## 2021-03-08 DIAGNOSIS — M47816 Spondylosis without myelopathy or radiculopathy, lumbar region: Secondary | ICD-10-CM

## 2021-03-08 MED ORDER — OXYCODONE HCL 10 MG PO TABS: 5.0000 mg | ORAL_TABLET | Freq: Three times a day (TID) | ORAL | 0 refills | Status: DC | PRN

## 2021-03-08 NOTE — Assessment & Plan Note (Addendum)
This is a pleasant 50 year old male, known knee osteoarthritis, we did a series of Synvisc back in 2019, unable to get viscosupplementation approved this time, deductible too high. We are going to go ahead and try PRP leukocyte poor into the right knee, today we performed the RegenLab PRP injection #1 into the right knee, if he still has some discomfort after a week we will do #2 and then potentially number three 1 week after that.  He knows to stay off of NSAIDs, adding a bit of oxycodone for postprocedural pain.

## 2021-03-08 NOTE — Progress Notes (Addendum)
    Procedures performed today:    Procedure: Real-time Leukocyte Poor Ultrasound Guided Platelet Rich Plasma (PRP) Injection of right knee Device: Samsung HS60  Verbal informed consent obtained.  Time-out conducted.  Noted no overlying erythema, induration, or other signs of local infection.  Obtained 10 cc of blood from peripheral vein, then using the "RegenLab" centrifuge, red blood cells were separated from the plasma.  We then drew the full 5 cc of leukocyte poor platelet rich plasma into a 10 cc syringe. Skin prepped in a sterile fashion.  Local anesthesia: Topical Ethyl chloride.  With sterile technique and under real time ultrasound guidance the platelet rich plasma (PRP) obtained above: A 22-gauge needle advanced into the suprapatellar recess where did see a trace effusion, I then injected the entirety of the leukocyte poor platelet rich plasma into the joint. Completed without difficulty  Advised to call if fevers/chills, erythema, induration, drainage, or persistent bleeding.  Images permanently stored and available for review in PACS.  Impression: Technically successful ultrasound guided Platelet Rich Plasma (PRP) injection.  Independent interpretation of notes and tests performed by another provider:     Brief History, Exam, Impression, and Recommendations:    Primary osteoarthritis of both knees This is a pleasant 50 year old male, known knee osteoarthritis, we did a series of Synvisc back in 2019, unable to get viscosupplementation approved this time, deductible too high. We are going to go ahead and try PRP leukocyte poor into the right knee, today we performed the RegenLab PRP injection #1 into the right knee, if he still has some discomfort after a week we will do #2 and then potentially number three 1 week after that.  He knows to stay off of NSAIDs, adding a bit of oxycodone for postprocedural pain.    ___________________________________________ [name].  Benjamin Stain, M.D., ABFM., CAQSM. Primary Care and Sports Medicine [facility]  Adjunct Instructor of Family Medicine  [employer]

## 2021-03-12 DIAGNOSIS — M9905 Segmental and somatic dysfunction of pelvic region: Secondary | ICD-10-CM | POA: Diagnosis not present

## 2021-03-12 DIAGNOSIS — M530 Cervicocranial syndrome: Secondary | ICD-10-CM | POA: Diagnosis not present

## 2021-03-12 DIAGNOSIS — M9904 Segmental and somatic dysfunction of sacral region: Secondary | ICD-10-CM | POA: Diagnosis not present

## 2021-03-12 DIAGNOSIS — M9903 Segmental and somatic dysfunction of lumbar region: Secondary | ICD-10-CM | POA: Diagnosis not present

## 2021-03-12 DIAGNOSIS — M9901 Segmental and somatic dysfunction of cervical region: Secondary | ICD-10-CM | POA: Diagnosis not present

## 2021-03-12 DIAGNOSIS — M9907 Segmental and somatic dysfunction of upper extremity: Secondary | ICD-10-CM | POA: Diagnosis not present

## 2021-03-12 DIAGNOSIS — M5451 Vertebrogenic low back pain: Secondary | ICD-10-CM | POA: Diagnosis not present

## 2021-03-20 DIAGNOSIS — M9905 Segmental and somatic dysfunction of pelvic region: Secondary | ICD-10-CM | POA: Diagnosis not present

## 2021-03-20 DIAGNOSIS — M9901 Segmental and somatic dysfunction of cervical region: Secondary | ICD-10-CM | POA: Diagnosis not present

## 2021-03-20 DIAGNOSIS — M9903 Segmental and somatic dysfunction of lumbar region: Secondary | ICD-10-CM | POA: Diagnosis not present

## 2021-03-20 DIAGNOSIS — M530 Cervicocranial syndrome: Secondary | ICD-10-CM | POA: Diagnosis not present

## 2021-03-20 DIAGNOSIS — M9904 Segmental and somatic dysfunction of sacral region: Secondary | ICD-10-CM | POA: Diagnosis not present

## 2021-03-20 DIAGNOSIS — M9907 Segmental and somatic dysfunction of upper extremity: Secondary | ICD-10-CM | POA: Diagnosis not present

## 2021-03-20 DIAGNOSIS — M5451 Vertebrogenic low back pain: Secondary | ICD-10-CM | POA: Diagnosis not present

## 2021-03-23 DIAGNOSIS — M9905 Segmental and somatic dysfunction of pelvic region: Secondary | ICD-10-CM | POA: Diagnosis not present

## 2021-03-23 DIAGNOSIS — M9904 Segmental and somatic dysfunction of sacral region: Secondary | ICD-10-CM | POA: Diagnosis not present

## 2021-03-23 DIAGNOSIS — M9901 Segmental and somatic dysfunction of cervical region: Secondary | ICD-10-CM | POA: Diagnosis not present

## 2021-03-23 DIAGNOSIS — M5451 Vertebrogenic low back pain: Secondary | ICD-10-CM | POA: Diagnosis not present

## 2021-03-23 DIAGNOSIS — M530 Cervicocranial syndrome: Secondary | ICD-10-CM | POA: Diagnosis not present

## 2021-03-23 DIAGNOSIS — M9903 Segmental and somatic dysfunction of lumbar region: Secondary | ICD-10-CM | POA: Diagnosis not present

## 2021-03-23 DIAGNOSIS — M9907 Segmental and somatic dysfunction of upper extremity: Secondary | ICD-10-CM | POA: Diagnosis not present

## 2021-03-30 DIAGNOSIS — M530 Cervicocranial syndrome: Secondary | ICD-10-CM | POA: Diagnosis not present

## 2021-03-30 DIAGNOSIS — M9901 Segmental and somatic dysfunction of cervical region: Secondary | ICD-10-CM | POA: Diagnosis not present

## 2021-03-30 DIAGNOSIS — M5451 Vertebrogenic low back pain: Secondary | ICD-10-CM | POA: Diagnosis not present

## 2021-03-30 DIAGNOSIS — M9903 Segmental and somatic dysfunction of lumbar region: Secondary | ICD-10-CM | POA: Diagnosis not present

## 2021-04-07 DIAGNOSIS — M9901 Segmental and somatic dysfunction of cervical region: Secondary | ICD-10-CM | POA: Diagnosis not present

## 2021-04-07 DIAGNOSIS — M9903 Segmental and somatic dysfunction of lumbar region: Secondary | ICD-10-CM | POA: Diagnosis not present

## 2021-04-07 DIAGNOSIS — M5451 Vertebrogenic low back pain: Secondary | ICD-10-CM | POA: Diagnosis not present

## 2021-04-07 DIAGNOSIS — M530 Cervicocranial syndrome: Secondary | ICD-10-CM | POA: Diagnosis not present

## 2021-04-21 DIAGNOSIS — M530 Cervicocranial syndrome: Secondary | ICD-10-CM | POA: Diagnosis not present

## 2021-04-21 DIAGNOSIS — M9903 Segmental and somatic dysfunction of lumbar region: Secondary | ICD-10-CM | POA: Diagnosis not present

## 2021-04-21 DIAGNOSIS — M9901 Segmental and somatic dysfunction of cervical region: Secondary | ICD-10-CM | POA: Diagnosis not present

## 2021-04-21 DIAGNOSIS — M5451 Vertebrogenic low back pain: Secondary | ICD-10-CM | POA: Diagnosis not present

## 2021-05-01 ENCOUNTER — Other Ambulatory Visit: Payer: Self-pay | Admitting: Sports Medicine

## 2021-05-05 DIAGNOSIS — M9901 Segmental and somatic dysfunction of cervical region: Secondary | ICD-10-CM | POA: Diagnosis not present

## 2021-05-05 DIAGNOSIS — M530 Cervicocranial syndrome: Secondary | ICD-10-CM | POA: Diagnosis not present

## 2021-05-05 DIAGNOSIS — M9903 Segmental and somatic dysfunction of lumbar region: Secondary | ICD-10-CM | POA: Diagnosis not present

## 2021-05-05 DIAGNOSIS — M5451 Vertebrogenic low back pain: Secondary | ICD-10-CM | POA: Diagnosis not present

## 2021-05-09 ENCOUNTER — Ambulatory Visit (INDEPENDENT_AMBULATORY_CARE_PROVIDER_SITE_OTHER): Payer: Commercial Managed Care - PPO | Admitting: Sports Medicine

## 2021-05-09 VITALS — BP 136/79 | HR 69 | Wt 187.0 lb

## 2021-05-09 DIAGNOSIS — Z1211 Encounter for screening for malignant neoplasm of colon: Secondary | ICD-10-CM | POA: Diagnosis not present

## 2021-05-09 DIAGNOSIS — M170 Bilateral primary osteoarthritis of knee: Secondary | ICD-10-CM

## 2021-05-09 DIAGNOSIS — Z0000 Encounter for general adult medical examination without abnormal findings: Secondary | ICD-10-CM

## 2021-05-09 DIAGNOSIS — Z23 Encounter for immunization: Secondary | ICD-10-CM | POA: Diagnosis not present

## 2021-05-09 DIAGNOSIS — N139 Obstructive and reflux uropathy, unspecified: Secondary | ICD-10-CM

## 2021-05-09 DIAGNOSIS — Z122 Encounter for screening for malignant neoplasm of respiratory organs: Secondary | ICD-10-CM

## 2021-05-09 MED ORDER — CELECOXIB 200 MG PO CAPS: ORAL_CAPSULE | ORAL | 2 refills | Status: DC

## 2021-05-09 MED ORDER — SHINGRIX 50 MCG/0.5ML IM SUSR: 0.5000 mL | Freq: Once | INTRAMUSCULAR | 0 refills | Status: AC

## 2021-05-09 MED ORDER — TAMSULOSIN HCL 0.4 MG PO CAPS: ORAL_CAPSULE | ORAL | 3 refills | Status: DC

## 2021-05-09 NOTE — Assessment & Plan Note (Signed)
Symptoms overall well controlled now with Cialis and tamsulosin.

## 2021-05-09 NOTE — Progress Notes (Addendum)
    Procedures performed today:    None.  Independent interpretation of notes and tests performed by another provider:   None.  Brief History, Exam, Impression, and Recommendations:    Annual physical exam Due for flu and Shingrix, Cologuard. Ordering all of the above, Shingrix will be done at his pharmacy. 20-pack-year smoking history, adding screening chest CT.  Obstructive uropathy Symptoms overall well controlled now with Cialis and tamsulosin.  Osteoarthritis of both knees Loistine Chance did get mild relief from the PRP leukocyte poor injection, he did not go through with injections #2 and 3 due to cost. He has been on meloxicam for some time now, we will rotate to Celebrex for now.  Chronic process with exacerbation and pharmacologic intervention  ___________________________________________ [name]. Benjamin Stain, M.D., ABFM., CAQSM. Primary Care and Sports Medicine [facility]  Adjunct Instructor of Family Medicine  [employer]

## 2021-05-09 NOTE — Assessment & Plan Note (Signed)
Frederick Ward did get mild relief from the PRP leukocyte poor injection, Frederick Ward did not go through with injections #2 and 3 due to cost. Frederick Ward has been on meloxicam for some time now, we will rotate to Celebrex for now.

## 2021-05-09 NOTE — Addendum Note (Signed)
Addended by: Monica Becton on: 05/09/2021 09:32 AM   Modules accepted: Orders

## 2021-05-09 NOTE — Assessment & Plan Note (Signed)
Due for flu and Shingrix, Cologuard. Ordering all of the above, Shingrix will be done at his pharmacy. 20-pack-year smoking history, adding screening chest CT.

## 2021-05-12 DIAGNOSIS — M5451 Vertebrogenic low back pain: Secondary | ICD-10-CM | POA: Diagnosis not present

## 2021-05-12 DIAGNOSIS — M9901 Segmental and somatic dysfunction of cervical region: Secondary | ICD-10-CM | POA: Diagnosis not present

## 2021-05-12 DIAGNOSIS — M530 Cervicocranial syndrome: Secondary | ICD-10-CM | POA: Diagnosis not present

## 2021-05-12 DIAGNOSIS — M9903 Segmental and somatic dysfunction of lumbar region: Secondary | ICD-10-CM | POA: Diagnosis not present

## 2021-05-13 ENCOUNTER — Other Ambulatory Visit: Payer: Self-pay | Admitting: Sports Medicine

## 2021-05-13 DIAGNOSIS — I208 Other forms of angina pectoris: Secondary | ICD-10-CM

## 2021-05-14 ENCOUNTER — Other Ambulatory Visit: Payer: Self-pay

## 2021-05-14 ENCOUNTER — Ambulatory Visit (INDEPENDENT_AMBULATORY_CARE_PROVIDER_SITE_OTHER): Payer: Commercial Managed Care - PPO

## 2021-05-14 DIAGNOSIS — Z87891 Personal history of nicotine dependence: Secondary | ICD-10-CM

## 2021-05-14 DIAGNOSIS — Z122 Encounter for screening for malignant neoplasm of respiratory organs: Secondary | ICD-10-CM

## 2021-05-16 ENCOUNTER — Ambulatory Visit (INDEPENDENT_AMBULATORY_CARE_PROVIDER_SITE_OTHER): Payer: Commercial Managed Care - PPO | Admitting: Family Medicine

## 2021-05-16 ENCOUNTER — Encounter: Payer: Self-pay | Admitting: Family Medicine

## 2021-05-16 DIAGNOSIS — Q6671 Congenital pes cavus, right foot: Secondary | ICD-10-CM

## 2021-05-16 DIAGNOSIS — Q6672 Congenital pes cavus, left foot: Secondary | ICD-10-CM

## 2021-05-16 NOTE — Assessment & Plan Note (Signed)
Acute on chronic in nature. Different surgeries in both feet.  - counseled on home exercise therapy and supportive care  - orthotics. May need to add scaphoid pads

## 2021-05-16 NOTE — Progress Notes (Signed)
Frederick Ward - 50 y.o. male MRN [id number]  Date of birth: 1971/06/29  SUBJECTIVE:  Including CC & ROS.  No chief complaint on file.   Frederick Ward is a 50 y.o. male that is  presenting with bilateral foot pain.  Reports to having multiple surgeries on his feet.  Has gotten improvement with orthotics in the past.   Review of Systems See HPI   HISTORY: Past Medical, Surgical, Social, and Family History Reviewed & Updated per EMR.   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 Bee sting   History of right peroneus brevis tear post repair 07/26/2014   Hyperlipidemia 07/26/2014   Impingement syndrome of right shoulder 03/01/2019   Lumbar facet arthropathy 02/23/2018   Lumbar facet joint syndrome 04/11/2016   Mild CAD 12/24/2017   Obstructive uropathy 03/19/2016   Osteoarthritis of both knees 07/26/2014   Postarthroscopy and partial meniscectomy on the right. Unloader brace on the right. Status post anterior cruciate ligament repair with reconstruction on the left.  Orthovisc approved    Other and unspecified hyperlipidemia 03/21/2014   Formatting of this note might be different from the original. Diet controlled   Pain    LEFT SHOULDER- ROTATOR CUFF  TEAR   Primary osteoarthritis of both knees 07/26/2014   Postarthroscopy and partial meniscectomy on the right. Unloader brace on the right. Status post anterior cruciate ligament repair with reconstruction on the left.  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 BRAIN CYST ( CYST NOT OPERTIVE )   Yellow jacket sting allergy     Past Surgical History:  Procedure Laterality Date   ANTERIOR CRUCIATE LIGAMENT REPAIR  1997 OR 1998   LEFT KNEE   LEFT ANKLE TENDON TRANSFER  FEB 2010   RIGHT ANKLE TENDON TRANSFER  SEPT 30, 2013   SURG[facility] DONE IN [facility]--PT WEARS  ORTHOTIC BOOT WHEN AMBULATING--HEALING INCISION--SOME WEAKNESS STILL   RIGHT WRIST FIXATION AUG  2006     SHOULDER ARTHROSCOPY WITH ROTATOR CUFF REPAIR AND SUBACROMIAL DECOMPRESSION  06/18/2012   Procedure: SHOULDER ARTHROSCOPY WITH ROTATOR CUFF REPAIR AND SUBACROMIAL DECOMPRESSION;  Surgeon: Javier Docker, MD;  Location: [facility];  Service: Orthopedics;  Laterality: Left;  Left Shoulder arthroscopy subacromial decompression mini open rotator cuff repair    VENTRICULOPERITONEAL SHUNT      Family History  Problem Relation Age of Onset   Diabetes Father    CAD Father    Hyperlipidemia Father    Arthritis Father    Arthritis Sister     Social History   Socioeconomic History   Marital status: Married    Spouse name: Not on file   Number of children: Not on file   Years of education: Not on file   Highest education level: Not on  file  Occupational History   Not on file  Tobacco Use   Smoking status: Former    Packs/day: 1.00    Years: 20.00    Pack years: 20.00    Types: Cigarettes   Smokeless tobacco: Never   Tobacco comments:    QUIT SMOKING 2011, has restarted   Vaping Use   Vaping Use: Never used  Substance and Sexual Activity   Alcohol use: No   Drug use: No   Sexual activity: Not on file  Other Topics Concern   Not on file  Social History Narrative   Not on file   Social Determinants of Health   Financial  Resource Strain: Not on file  Food Insecurity: Not on file  Transportation Needs: Not on file  Physical Activity: Not on file  Stress: Not on file  Social Connections: Not on file  Intimate Partner Violence: Not on file     PHYSICAL EXAM:  VS: BP 122/80 (BP Location: Left Arm, Patient Position: Sitting)   Ht 5\' 7"  (1.702 m)   Wt 187 lb (84.8 kg)   BMI 29.29 kg/m  Physical Exam Gen: NAD, alert, cooperative with exam, well-appearing    Patient was fitted for a standard, cushioned, semi-rigid orthotic. The orthotic was heated and afterward the patient stood on the orthotic blank positioned on the orthotic stand. The patient was positioned in subtalar neutral position and 10 degrees of ankle dorsiflexion in a weight bearing stance. After completion of molding, a stable base was applied to the orthotic blank. The blank was ground to a stable position for weight bearing. Size: 9 Pairs: 2 Base: Blue EVA Additional Posting and Padding: None The patient ambulated these, and they were very comfortable.   ASSESSMENT & PLAN:   Pes cavus of both feet Acute on chronic in nature. Different surgeries in both feet.  - counseled on home exercise therapy and supportive care  - orthotics. May need to add scaphoid pads

## 2021-05-23 LAB — COLOGUARD: COLOGUARD: NEGATIVE

## 2021-06-05 ENCOUNTER — Other Ambulatory Visit: Payer: Self-pay | Admitting: Cardiology

## 2021-07-09 ENCOUNTER — Other Ambulatory Visit: Payer: Self-pay | Admitting: Sports Medicine

## 2021-07-09 DIAGNOSIS — F17200 Nicotine dependence, unspecified, uncomplicated: Secondary | ICD-10-CM

## 2021-08-03 ENCOUNTER — Other Ambulatory Visit: Payer: Self-pay | Admitting: Sports Medicine

## 2021-08-03 DIAGNOSIS — M170 Bilateral primary osteoarthritis of knee: Secondary | ICD-10-CM

## 2021-08-07 ENCOUNTER — Other Ambulatory Visit: Payer: Self-pay | Admitting: Cardiology

## 2021-08-07 ENCOUNTER — Other Ambulatory Visit: Payer: Self-pay | Admitting: Sports Medicine

## 2021-08-07 DIAGNOSIS — E785 Hyperlipidemia, unspecified: Secondary | ICD-10-CM

## 2021-08-07 DIAGNOSIS — N139 Obstructive and reflux uropathy, unspecified: Secondary | ICD-10-CM

## 2021-08-07 DIAGNOSIS — I25118 Atherosclerotic heart disease of native coronary artery with other forms of angina pectoris: Secondary | ICD-10-CM

## 2021-08-21 ENCOUNTER — Other Ambulatory Visit: Payer: Self-pay | Admitting: Sports Medicine

## 2021-08-27 ENCOUNTER — Encounter: Payer: Self-pay | Admitting: Sports Medicine

## 2021-08-28 MED ORDER — NITROGLYCERIN 0.2 MG/HR TD PT24: MEDICATED_PATCH | TRANSDERMAL | 11 refills | Status: DC

## 2021-10-22 ENCOUNTER — Other Ambulatory Visit: Payer: Self-pay

## 2021-10-22 MED ORDER — ROSUVASTATIN CALCIUM 20 MG PO TABS: 20.0000 mg | ORAL_TABLET | Freq: Every day | ORAL | 1 refills | Status: DC

## 2021-10-23 ENCOUNTER — Other Ambulatory Visit: Payer: Self-pay

## 2021-10-23 DIAGNOSIS — I208 Other forms of angina pectoris: Secondary | ICD-10-CM

## 2021-10-23 DIAGNOSIS — F17200 Nicotine dependence, unspecified, uncomplicated: Secondary | ICD-10-CM

## 2021-10-23 MED ORDER — ASPIRIN 81 MG PO TBEC: 81.0000 mg | DELAYED_RELEASE_TABLET | Freq: Every day | ORAL | 1 refills | Status: DC

## 2021-10-23 MED ORDER — METOPROLOL SUCCINATE ER 50 MG PO TB24: ORAL_TABLET | ORAL | 1 refills | Status: DC

## 2021-10-23 MED ORDER — VARENICLINE TARTRATE 1 MG PO TABS: 1.0000 mg | ORAL_TABLET | Freq: Two times a day (BID) | ORAL | 1 refills | Status: DC

## 2021-11-03 ENCOUNTER — Other Ambulatory Visit: Payer: Self-pay | Admitting: Sports Medicine

## 2021-11-03 DIAGNOSIS — M170 Bilateral primary osteoarthritis of knee: Secondary | ICD-10-CM

## 2021-12-05 ENCOUNTER — Other Ambulatory Visit: Payer: Self-pay | Admitting: Cardiology

## 2022-01-04 ENCOUNTER — Other Ambulatory Visit: Payer: Self-pay | Admitting: Cardiology

## 2022-01-09 ENCOUNTER — Other Ambulatory Visit: Payer: Self-pay

## 2022-01-09 DIAGNOSIS — F17200 Nicotine dependence, unspecified, uncomplicated: Secondary | ICD-10-CM

## 2022-01-09 MED ORDER — VARENICLINE TARTRATE 1 MG PO TABS: 1.0000 mg | ORAL_TABLET | Freq: Two times a day (BID) | ORAL | 1 refills | Status: DC

## 2022-01-22 ENCOUNTER — Other Ambulatory Visit: Payer: Self-pay | Admitting: Cardiology

## 2022-02-01 ENCOUNTER — Other Ambulatory Visit: Payer: Self-pay | Admitting: Cardiology

## 2022-02-01 ENCOUNTER — Other Ambulatory Visit: Payer: Self-pay | Admitting: Sports Medicine

## 2022-02-01 DIAGNOSIS — I25118 Atherosclerotic heart disease of native coronary artery with other forms of angina pectoris: Secondary | ICD-10-CM

## 2022-02-01 DIAGNOSIS — M170 Bilateral primary osteoarthritis of knee: Secondary | ICD-10-CM

## 2022-02-01 DIAGNOSIS — E785 Hyperlipidemia, unspecified: Secondary | ICD-10-CM

## 2022-03-04 ENCOUNTER — Other Ambulatory Visit: Payer: Self-pay | Admitting: Cardiology

## 2022-03-04 DIAGNOSIS — E785 Hyperlipidemia, unspecified: Secondary | ICD-10-CM

## 2022-03-04 DIAGNOSIS — I25118 Atherosclerotic heart disease of native coronary artery with other forms of angina pectoris: Secondary | ICD-10-CM

## 2022-03-06 ENCOUNTER — Other Ambulatory Visit: Payer: Self-pay | Admitting: Cardiology

## 2022-03-06 NOTE — Telephone Encounter (Signed)
Rosuvastatin 20 mg # 62 x 4 refills sent to [facility] ([facility]) - [place], [place] - [phone]

## 2022-03-20 ENCOUNTER — Other Ambulatory Visit: Payer: Self-pay | Admitting: Sports Medicine

## 2022-03-20 DIAGNOSIS — I208 Other forms of angina pectoris: Secondary | ICD-10-CM

## 2022-03-27 ENCOUNTER — Ambulatory Visit: Payer: Self-pay | Admitting: Cardiology

## 2022-04-01 ENCOUNTER — Encounter: Payer: Self-pay | Admitting: Sports Medicine

## 2022-04-01 DIAGNOSIS — S92354G Nondisplaced fracture of fifth metatarsal bone, right foot, subsequent encounter for fracture with delayed healing: Secondary | ICD-10-CM

## 2022-04-02 ENCOUNTER — Other Ambulatory Visit: Payer: Self-pay | Admitting: Cardiology

## 2022-04-02 DIAGNOSIS — I25118 Atherosclerotic heart disease of native coronary artery with other forms of angina pectoris: Secondary | ICD-10-CM

## 2022-04-02 DIAGNOSIS — E785 Hyperlipidemia, unspecified: Secondary | ICD-10-CM

## 2022-04-02 NOTE — Telephone Encounter (Signed)
Rx refill sent to pharmacy. 

## 2022-04-03 ENCOUNTER — Ambulatory Visit (INDEPENDENT_AMBULATORY_CARE_PROVIDER_SITE_OTHER): Payer: 59 | Admitting: Sports Medicine

## 2022-04-03 ENCOUNTER — Ambulatory Visit (INDEPENDENT_AMBULATORY_CARE_PROVIDER_SITE_OTHER): Payer: 59

## 2022-04-03 DIAGNOSIS — M84374A Stress fracture, right foot, initial encounter for fracture: Secondary | ICD-10-CM | POA: Diagnosis not present

## 2022-04-03 DIAGNOSIS — Z0000 Encounter for general adult medical examination without abnormal findings: Secondary | ICD-10-CM

## 2022-04-03 MED ORDER — EPINEPHRINE 0.3 MG/0.3ML IJ SOAJ: 0.3000 mg | INTRAMUSCULAR | 11 refills | Status: DC | PRN

## 2022-04-03 NOTE — Progress Notes (Signed)
    Procedures performed today:    None.  Independent interpretation of notes and tests performed by another provider:   None.  Brief History, Exam, Impression, and Recommendations:    Stress fracture of fourth metatarsal bone of right foot Pleasant 51 year old male, he has a 55-month history of increasing pain right foot dorsal lateral. 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 No trauma. On exam he does have tenderness over the fourth metatarsal shaft. I do suspect early stress injury, adding x-rays, postop shoe immobilization, partial weightbearing at work i.e. half days. Return to see me in about 6 weeks we will do an MRI if not better. 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    ____________________________________________ Gwen Her. Dianah Field, M.D., ABFM., CAQSM., AME. Primary Care and Sports Medicine [facility]  Adjunct Professor of [place] [employer]  Risk manager

## 2022-04-03 NOTE — Assessment & Plan Note (Addendum)
Pleasant 52 year old male, he has a 8-month history of increasing pain right foot dorsal lateral. 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 No trauma. On exam he does have tenderness over the fourth metatarsal shaft. I do suspect early stress injury, adding x-rays, postop shoe immobilization, partial weightbearing at work i.e. half days. Return to see me in about 6 weeks we will do an MRI if not better. 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

## 2022-04-05 ENCOUNTER — Encounter: Payer: Self-pay | Admitting: Sports Medicine

## 2022-04-09 ENCOUNTER — Other Ambulatory Visit: Payer: Self-pay | Admitting: Sports Medicine

## 2022-04-12 DIAGNOSIS — S92354A Nondisplaced fracture of fifth metatarsal bone, right foot, initial encounter for closed fracture: Secondary | ICD-10-CM | POA: Insufficient documentation

## 2022-04-12 NOTE — Assessment & Plan Note (Signed)
Suspect Jones fracture, I would like a consultation with podiatry regarding lag screw placement.

## 2022-04-18 ENCOUNTER — Encounter: Payer: Self-pay | Admitting: Podiatrist

## 2022-04-18 ENCOUNTER — Encounter: Payer: Self-pay | Admitting: Cardiology

## 2022-04-18 ENCOUNTER — Ambulatory Visit (INDEPENDENT_AMBULATORY_CARE_PROVIDER_SITE_OTHER): Payer: 59 | Admitting: Podiatrist

## 2022-04-18 ENCOUNTER — Ambulatory Visit: Payer: 59 | Attending: Cardiology | Admitting: Cardiology

## 2022-04-18 VITALS — BP 108/78 | HR 65 | Ht 67.0 in | Wt 187.0 lb

## 2022-04-18 DIAGNOSIS — I2510 Atherosclerotic heart disease of native coronary artery without angina pectoris: Secondary | ICD-10-CM

## 2022-04-18 DIAGNOSIS — S99191D Other physeal fracture of right metatarsal, subsequent encounter for fracture with routine healing: Secondary | ICD-10-CM | POA: Diagnosis not present

## 2022-04-18 DIAGNOSIS — Z982 Presence of cerebrospinal fluid drainage device: Secondary | ICD-10-CM

## 2022-04-18 DIAGNOSIS — F17200 Nicotine dependence, unspecified, uncomplicated: Secondary | ICD-10-CM | POA: Diagnosis not present

## 2022-04-18 DIAGNOSIS — E782 Mixed hyperlipidemia: Secondary | ICD-10-CM

## 2022-04-18 DIAGNOSIS — R072 Precordial pain: Secondary | ICD-10-CM

## 2022-04-18 DIAGNOSIS — I6529 Occlusion and stenosis of unspecified carotid artery: Secondary | ICD-10-CM

## 2022-04-18 NOTE — Progress Notes (Signed)
Cardiology Office Note:    Date:  04/18/2022   ID:  Frederick Ward, DOB 1970/09/17, MRN [id number]  PCP:  Silverio Decamp, MD  Cardiologist:  Jenne Campus, MD    Referring MD: Silverio Decamp,*   Chief Complaint  Patient presents with   yearly check    History of Present Illness:    Frederick Ward is a 51 y.o. male with past medical history significant for coronary artery disease, in 2019 he did have coronary CT angio done which showed less than 30% proximal LAD 50% mid LAD, 50% diagonal branch, 30% circumflex branch.  Since that time he is being aggressively managed with risk factors modification doing well.  He also got brain shunt done years ago, degenerative disc disease, atherosclerosis of aorta, dyslipidemia. He comes to my office for follow-up.  Overall he is doing very well.  He denies have any chest pain tightness squeezing pressure burning chest no palpitation dizziness swelling of lower extremities.  He is trying to be active but he sustained some fracture of his right foot and that this slows him down a lot.  Past Medical History:  Diagnosis Date   Annual physical exam 07/26/2014   Chest pain 08/25/2017   Colloid cyst of brain ([place]) 02/23/2018   DDD (degenerative disc disease), cervical 05/22/2018   Exercise-induced bronchoconstriction 09/14/2019   History of allergic reaction 03/21/2014   Formatting of this note might be different from the original. Bee sting   History of right peroneus brevis tear post repair 07/26/2014   Hyperlipidemia 07/26/2014   Impingement syndrome of right shoulder 03/01/2019   Lumbar facet arthropathy 02/23/2018   Lumbar facet joint syndrome 04/11/2016   Mild CAD 12/24/2017   Obstructive uropathy 03/19/2016   Osteoarthritis of both knees 07/26/2014   Postarthroscopy and partial meniscectomy on the right. Unloader brace on the right. Status post anterior cruciate ligament repair with reconstruction on the left.  Orthovisc approved    Other and  unspecified hyperlipidemia 03/21/2014   Formatting of this note might be different from the original. Diet controlled   Pain    LEFT SHOULDER- ROTATOR CUFF  TEAR   Primary osteoarthritis of both knees 07/26/2014   Postarthroscopy and partial meniscectomy on the right. Unloader brace on the right. Status post anterior cruciate ligament repair with reconstruction on the left.  Orthovisc approved    Primary osteoarthritis of first carpometacarpal joint of right hand 06/04/2016   Primary osteoarthritis of right foot 08/16/2016   Right foot pain 03/01/2019   Rotator cuff tear, left 06/18/2012   S/P ventriculoperitoneal shunt 07/26/2014   Smoker 08/19/2017   Stable angina 08/19/2017   Tinea pedis, left 05/22/2018   VP (ventriculoperitoneal) shunt status 2009   FOR OBSTRUCTIVE HYDROCEHALUS - CAUSED BY COLLOID BRAIN CYST ( CYST NOT OPERTIVE )   Yellow jacket sting allergy     Past Surgical History:  Procedure Laterality Date   [place]   LEFT KNEE   LEFT ANKLE TENDON TRANSFER  FEB 2010   RIGHT ANKLE TENDON TRANSFER  SEPT 30, 2013   SURGERY WAS DONE IN [facility]--PT WEARS  ORTHOTIC BOOT WHEN AMBULATING--HEALING INCISION--SOME WEAKNESS STILL   RIGHT WRIST FIXATION AUG  2006     SHOULDER ARTHROSCOPY WITH ROTATOR CUFF REPAIR AND SUBACROMIAL DECOMPRESSION  06/18/2012   Procedure: SHOULDER ARTHROSCOPY WITH ROTATOR CUFF REPAIR AND SUBACROMIAL DECOMPRESSION;  Surgeon: Johnn Hai, MD;  Location: [facility];  Service: Orthopedics;  Laterality: Left;  Left Shoulder arthroscopy subacromial decompression mini open rotator cuff repair    VENTRICULOPERITONEAL SHUNT      Current Medications: Current Meds  Medication Sig   ASPIRIN LOW DOSE 81 MG tablet TAKE 1 TABLET BY MOUTH DAILY  (SWALLOW WHOLE) (Patient taking differently: Take 81 mg by mouth daily.)   celecoxib (CELEBREX) 200 MG capsule TAKE 1 TO 2 TABLETS BY MOUTH DAILY AS NEEDED FOR PAIN (Patient taking  differently: Take 200 mg by mouth as needed for mild pain or moderate pain. TAKE 1 TO 2 TABLETS BY MOUTH DAILY AS NEEDED FOR PAIN)   EPINEPHrine 0.3 mg/0.3 mL IJ SOAJ injection Inject 0.3 mg into the muscle as needed for anaphylaxis.   ezetimibe (ZETIA) 10 MG tablet Take 1 tablet (10 mg total) by mouth daily.   fish oil-omega-3 fatty acids 1000 MG capsule Take 1 g by mouth daily.   metoprolol succinate (TOPROL-XL) 50 MG 24 hr tablet TAKE 1 TABLET BY MOUTH DAILY  WITH OR IMMEDIATELY FOLLOWING A  MEAL (Patient taking differently: Take 50 mg by mouth daily. TAKE 1 TABLET BY MOUTH DAILY  WITH OR IMMEDIATELY FOLLOWING A  MEAL)   Multiple Vitamin (MULTIVITAMIN WITH MINERALS) TABS Take 1 tablet by mouth daily.   nitroGLYCERIN (NITRODUR - DOSED IN MG/24 HR) 0.2 mg/hr patch Cut and apply 1/4 patch to most painful area q24h. (Patient taking differently: Place 0.2 mg onto the skin See admin instructions. Cut and apply 1/4 patch to most painful area q24h.)   rosuvastatin (CRESTOR) 20 MG tablet TAKE 1 TABLET BY MOUTH DAILY   tadalafil (CIALIS) 10 MG tablet TAKE ONE TABLET BY MOUTH DAILY (Patient taking differently: Take 10 mg by mouth daily as needed for erectile dysfunction.)   tamsulosin (FLOMAX) 0.4 MG CAPS capsule TAKE TWO CAPSULES BY MOUTH EVERY MORNING AFTER BREAKFAST (Patient taking differently: Take 0.8 mg by mouth daily after breakfast. TAKE TWO CAPSULES BY MOUTH EVERY MORNING AFTER BREAKFAST)   varenicline (CHANTIX) 1 MG tablet Take 1 tablet (1 mg total) by mouth 2 (two) times daily.     Allergies:   Bee pollen and Bee venom   Social History   Socioeconomic History   Marital status: Married    Spouse name: Not on file   Number of children: Not on file   Years of education: Not on file   Highest education level: Not on file  Occupational History   Not on file  Tobacco Use   Smoking status: Former    Packs/day: 1.00    Years: 20.00    Total pack years: 20.00    Types: Cigarettes   Smokeless  tobacco: Never   Tobacco comments:    QUIT SMOKING 2011, has restarted   Vaping Use   Vaping Use: Never used  Substance and Sexual Activity   Alcohol use: No   Drug use: No   Sexual activity: Not on file  Other Topics Concern   Not on file  Social History Narrative   Not on file   Social Determinants of Health   Financial Resource Strain: Not on file  Food Insecurity: Not on file  Transportation Needs: Not on file  Physical Activity: Not on file  Stress: Not on file  Social Connections: Not on file     Family History: The patient's family history includes Arthritis in his father and sister; CAD in his father; Diabetes in his father; Hyperlipidemia in his father. ROS:   Please see the history of present illness.  All 14 point review of systems negative except as described per history of present illness  EKGs/Labs/Other Studies Reviewed:      Recent Labs: No results found for requested labs within last 365 days.  Recent Lipid Panel    Component Value Date/Time   CHOL 87 (L) 12/21/2020 0836   TRIG 61 12/21/2020 0836   HDL 38 (L) 12/21/2020 0836   CHOLHDL 2.3 12/21/2020 0836   CHOLHDL 3.4 08/19/2017 1629   VLDL 19 04/08/2016 0832   LDLCALC 35 12/21/2020 0836   LDLCALC 74 08/19/2017 1629    Physical Exam:    VS:  BP 108/78 (BP Location: Left Arm, Patient Position: Sitting)   Pulse 65   Ht 5\' 7"  (1.702 m)   Wt 187 lb (84.8 kg)   SpO2 97%   BMI 29.29 kg/m     Wt Readings from Last 3 Encounters:  04/18/22 187 lb (84.8 kg)  05/16/21 187 lb (84.8 kg)  05/09/21 187 lb (84.8 kg)     GEN:  Well nourished, well developed in no acute distress HEENT: Normal NECK: No JVD; No carotid bruits LYMPHATICS: No lymphadenopathy CARDIAC: RRR, no murmurs, no rubs, no gallops RESPIRATORY:  Clear to auscultation without rales, wheezing or rhonchi  ABDOMEN: Soft, non-tender, non-distended MUSCULOSKELETAL:  No edema; No deformity  SKIN: Warm and dry LOWER EXTREMITIES: no  swelling NEUROLOGIC:  Alert and oriented x 3 PSYCHIATRIC:  Normal affect   ASSESSMENT:    1. Mild CAD   2. Precordial pain   3. Smoker   4. Mixed hyperlipidemia   5. S/P ventriculoperitoneal shunt    PLAN:    In order of problems listed above:  Mild CAD based on coronary CT angio from 2019, stress test done a year ago in the summer 2022 showing no evidence of ischemia.  Overall doing very well he is on antiplatelet therapy which I will continue, his cholesterol is excellent, he quit smoking I encouraged him to stay away from smoking.  Because of fairly advanced for his age atherosclerosis I decided to schedule him to have carotic ultrasounds to make sure he does not have any cardiac obstructive disease. Precordial chest pain: Denies having any. History of smoking likely he quit smoking 1-1/2 years ago he abstain from smoking and I encouraged him to stay away from it. Dyslipidemia I did review K PN from last year LDL 35 HDL 38 excellent control he is getting ready to see his primary care physician ask him to have fasting lipid profile done. Overall he is doing well we discussed basic of Mediterranean diet need to exercise on the regular basis.  I will see him back in 1 year   Medication Adjustments/Labs and Tests Ordered: Current medicines are reviewed at length with the patient today.  Concerns regarding medicines are outlined above.  No orders of the defined types were placed in this encounter.  Medication changes: No orders of the defined types were placed in this encounter.   Signed, 02-15-1980, MD, [facility] 04/18/2022 4:55 PM    [facility]

## 2022-04-18 NOTE — Patient Instructions (Addendum)
Medication Instructions:  Your physician recommends that you continue on your current medications as directed. Please refer to the Current Medication list given to you today.  *If you need a refill on your cardiac medications before your next appointment, please call your pharmacy*   Lab Work: 2nd [facility] need to have labs done when you are fasting.  You can come Monday through Friday 8:00 am to 11:30am and 1:00 to 4:30. You do not need to make an appointment as the order has already been placed. The labs you are going to have done are Lipids.    Testing/Procedures: Your physician has requested that you have a carotid duplex. This test is an ultrasound of the carotid arteries in your neck. It looks at blood flow through these arteries that supply the brain with blood. Allow one hour for this exam. There are no restrictions or special instructions.    Follow-Up: At [facility], you and your health needs are our priority.  As part of our continuing mission to provide you with exceptional heart care, we have created designated Provider Care Teams.  These Care Teams include your primary Cardiologist (physician) and Advanced Practice Providers (APPs -  Physician Assistants and Nurse Practitioners) who all work together to provide you with the care you need, when you need it.  We recommend signing up for the patient portal called "MyChart".  Sign up information is provided on this After Visit Summary.  MyChart is used to connect with patients for Virtual Visits (Telemedicine).  Patients are able to view lab/test results, encounter notes, upcoming appointments, etc.  Non-urgent messages can be sent to your provider as well.   To learn more about what you can do with MyChart, go to [email or web address].    Your next appointment:   12 month(s)  The format for your next appointment:   In Person  Provider:   Jenne Campus, MD    Other Instructions NA

## 2022-04-18 NOTE — Progress Notes (Signed)
Chief Complaint  Patient presents with   Foot Injury    Right foot fracture      HPI: Patient is 51 y.o. male who presents today for follow-up for right foot fracture.  He states that he was experiencing some diffuse pain on the right foot which he thought was tendinitis and then about 3 weeks ago he was playing with his son and noticed the pain became more constant in his foot.  He gave it some time and the pain failed to resolve and therefore he was seen for an x-ray on 04/03/2022 which showed a fracture of the fifth metatarsal right foot.  He was dispensed a surgical shoe and is here for follow-up.  He relates he has had several surgeries to his feet for high arches in his right foot in particular had a calcaneal slide osteotomy, peroneal tendon repair as well as metatarsal osteotomies.  He has healed well from the surgeries that he has had in the past.  Overall he relates minimal pain right foot.  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[place]) 02/23/2018   Mild CAD 12/24/2017   Chest pain 08/25/2017   Stable angina 08/19/2017   Smoker 08/19/2017   Primary osteoarthritis of right foot 08/16/2016   Primary osteoarthritis of first carpometacarpal joint of right hand 06/04/2016   Lumbar facet joint syndrome 04/11/2016   Obstructive uropathy 03/19/2016   Primary osteoarthritis of both knees 07/26/2014   History of right peroneus brevis tear post repair 07/26/2014   S/P ventriculoperitoneal shunt 07/26/2014    Annual physical exam 07/26/2014   Osteoarthritis of both knees 07/26/2014   Hyperlipidemia 07/26/2014   Other and unspecified hyperlipidemia 03/21/2014   History of allergic reaction 03/21/2014   Rotator cuff tear, left 06/18/2012   VP (ventriculoperitoneal) shunt status 2009    Current Outpatient Medications on File Prior to Visit  Medication Sig Dispense Refill   ASPIRIN LOW DOSE 81 MG tablet TAKE 1 TABLET BY MOUTH DAILY  (SWALLOW WHOLE) 90 tablet 1   celecoxib (CELEBREX) 200 MG capsule TAKE 1 TO 2 TABLETS BY MOUTH DAILY AS NEEDED FOR PAIN 180 capsule 0   EPINEPHrine 0.3 mg/0.3 mL IJ SOAJ injection Inject 0.3 mg into the muscle as needed for anaphylaxis. 1 each 11   ezetimibe (ZETIA) 10 MG tablet Take 1 tablet (10 mg total) by mouth daily. 30 tablet 0   fish oil-omega-3 fatty acids 1000 MG capsule Take 1 g by mouth daily.     metoprolol succinate (TOPROL-XL) 50 MG 24 hr tablet TAKE 1 TABLET BY MOUTH DAILY  WITH OR IMMEDIATELY FOLLOWING A  MEAL 90 tablet 3   Multiple Vitamin (MULTIVITAMIN WITH MINERALS) TABS Take 1 tablet by mouth daily.     nitroGLYCERIN (NITRODUR - DOSED IN MG/24 HR) 0.2 mg/hr patch Cut and apply 1/4 patch to most painful area q24h. 30 patch 11   rosuvastatin (CRESTOR) 20 MG tablet TAKE 1 TABLET BY MOUTH DAILY 62 tablet 4   tadalafil (CIALIS) 10 MG  tablet TAKE ONE TABLET BY MOUTH DAILY 90 tablet 3   tamsulosin (FLOMAX) 0.4 MG CAPS capsule TAKE TWO CAPSULES BY MOUTH EVERY MORNING AFTER BREAKFAST 180 capsule 0   varenicline (CHANTIX) 1 MG tablet Take 1 tablet (1 mg total) by mouth 2 (two) times daily. 168 tablet 1   No current facility-administered medications on file prior to visit.    Allergies  Allergen Reactions   Bee Pollen Other (See Comments)    syncope syncope   Bee Venom Other (See Comments)    Numbness and syncope *fell x2 and muscles wouldn't work well, 21 Mar 2005    Review of Systems No fevers, chills, nausea, muscle aches, no difficulty breathing,  no calf pain, no chest pain or shortness of breath.   Physical Exam  GENERAL APPEARANCE: Alert, conversant. Appropriately groomed. No acute distress.   VASCULAR: Pedal pulses palpable 2/4 DP and  PT right foot.  Capillary refill time is immediate to all digits,  Proximal to distal cooling it warm to warm.  Digital perfusion adequate.   NEUROLOGIC: sensation is intact to 5.07 monofilament at 5/5 sites right foot.  Light touch is intact bilateral, vibratory sensation intact bilateral  MUSCULOSKELETAL: High arch foot type is noted right foot.  No gross boney pedal deformities noted.  No pain, no bruising, no swelling is noted no pinpoint tenderness identified right foot--along the fifth metatarsal of the right foot.  DERMATOLOGIC: skin is warm, supple, and dry.  Color, texture, and turgor of skin within normal limits.    X-rays are reviewed and do show a Jones fracture fifth metatarsal of the right foot.  It is visible on all views.  It is nondisplaced.  Minimal gapping is noted.  Evidence of a previous screw across the calcaneus from a prior calcaneal osteotomy is also seen as well as evidence from his previous metatarsal surgeries.  No other acute osseous abnormalities seen on these views.    Assessment      ICD-10-CM   1. Closed fracture of base of fifth metatarsal bone of right foot at metaphyseal-diaphyseal junction with routine healing, subsequent encounter  S99.191D        Plan  Discussed exam and x-ray findings with the patient.  Who presents today with a surgical shoe and I recommended the use of the boot.  He states he has a boot at home or would like to order one from [facility] therefore no boot was dispensed at today's visit.  I recommended he wear the boot faithfully for 4 weeks and he will follow-up in our [place] office with Dr. Ardelle Anton in 4 weeks to assess if any healing has occurred.  Discussed that he may be a candidate for screw fixation for this particular fracture.   Patient is amenable to this plan and will follow up in our [place] office if Dr. Ardelle Anton thinks it better to wait on surgery.  Patient will call if any concerns arise in the meantime and otherwise knows to wear the boot at all times except when sleeping or driving.

## 2022-04-29 ENCOUNTER — Other Ambulatory Visit: Payer: Self-pay | Admitting: Cardiology

## 2022-04-29 DIAGNOSIS — E785 Hyperlipidemia, unspecified: Secondary | ICD-10-CM

## 2022-04-29 DIAGNOSIS — I25118 Atherosclerotic heart disease of native coronary artery with other forms of angina pectoris: Secondary | ICD-10-CM

## 2022-04-29 NOTE — Telephone Encounter (Signed)
Zetia refills sent to pharmacy

## 2022-05-02 ENCOUNTER — Ambulatory Visit: Payer: 59 | Admitting: Podiatrist

## 2022-05-03 ENCOUNTER — Other Ambulatory Visit: Payer: Self-pay | Admitting: Sports Medicine

## 2022-05-03 DIAGNOSIS — M170 Bilateral primary osteoarthritis of knee: Secondary | ICD-10-CM

## 2022-05-07 ENCOUNTER — Telehealth: Payer: Self-pay

## 2022-05-07 DIAGNOSIS — F17200 Nicotine dependence, unspecified, uncomplicated: Secondary | ICD-10-CM

## 2022-05-07 MED ORDER — VARENICLINE TARTRATE 1 MG PO TABS: 1.0000 mg | ORAL_TABLET | Freq: Two times a day (BID) | ORAL | 1 refills | Status: DC

## 2022-05-07 NOTE — Telephone Encounter (Signed)
Patient called to request a refill for the chantix be sent to [facility].

## 2022-05-09 ENCOUNTER — Encounter: Payer: Self-pay | Admitting: Sports Medicine

## 2022-05-09 ENCOUNTER — Ambulatory Visit (INDEPENDENT_AMBULATORY_CARE_PROVIDER_SITE_OTHER): Payer: 59 | Admitting: Sports Medicine

## 2022-05-09 VITALS — BP 119/76 | HR 68 | Ht 67.0 in | Wt 189.0 lb

## 2022-05-09 DIAGNOSIS — N139 Obstructive and reflux uropathy, unspecified: Secondary | ICD-10-CM

## 2022-05-09 DIAGNOSIS — Z87891 Personal history of nicotine dependence: Secondary | ICD-10-CM | POA: Diagnosis not present

## 2022-05-09 DIAGNOSIS — Z23 Encounter for immunization: Secondary | ICD-10-CM

## 2022-05-09 DIAGNOSIS — Z0000 Encounter for general adult medical examination without abnormal findings: Secondary | ICD-10-CM | POA: Diagnosis not present

## 2022-05-09 DIAGNOSIS — E782 Mixed hyperlipidemia: Secondary | ICD-10-CM

## 2022-05-09 DIAGNOSIS — I2510 Atherosclerotic heart disease of native coronary artery without angina pectoris: Secondary | ICD-10-CM

## 2022-05-09 MED ORDER — NITROGLYCERIN 0.4 MG SL SUBL: 0.4000 mg | SUBLINGUAL_TABLET | SUBLINGUAL | 3 refills | Status: DC | PRN

## 2022-05-09 NOTE — Assessment & Plan Note (Signed)
Refilling nitroglycerin, understands not to use this with Cialis.

## 2022-05-09 NOTE — Progress Notes (Signed)
Subjective:    CC: Annual Physical Exam  HPI:  This patient is here for their annual physical  I reviewed the past medical history, family history, social history, surgical history, and allergies today and no changes were needed.  Please see the problem list section below in epic for further details.  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 Bee sting   History of right peroneus brevis tear post repair 07/26/2014   Hyperlipidemia 07/26/2014   Impingement syndrome of right shoulder 03/01/2019   Lumbar facet arthropathy 02/23/2018   Lumbar facet joint syndrome 04/11/2016   Mild CAD 12/24/2017   Obstructive uropathy 03/19/2016   Osteoarthritis of both knees 07/26/2014   Postarthroscopy and partial meniscectomy on the right. Unloader brace on the right. Status post anterior cruciate ligament repair with reconstruction on the left.  Orthovisc approved    Other and unspecified hyperlipidemia 03/21/2014   Formatting of this note might be different from the original. Diet controlled   Pain    LEFT SHOULDER- ROTATOR CUFF  TEAR   Primary osteoarthritis of both knees 07/26/2014   Postarthroscopy and partial meniscectomy on the right. Unloader brace on the right. Status post anterior cruciate ligament repair with reconstruction on the left.  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 BRAIN CYST ( CYST NOT OPERTIVE )   Yellow jacket sting allergy    Past Surgical History: Past Surgical History:  Procedure Laterality Date   [place]   LEFT KNEE   LEFT ANKLE TENDON TRANSFER  FEB 2010   RIGHT ANKLE TENDON TRANSFER  SEPT 30, 2013   SURGERY WAS DONE IN [facility]--PT WEARS  ORTHOTIC BOOT WHEN AMBULATING--HEALING INCISION--SOME WEAKNESS STILL   RIGHT WRIST FIXATION AUG  2006     SHOULDER ARTHROSCOPY WITH ROTATOR CUFF REPAIR AND SUBACROMIAL DECOMPRESSION  06/18/2012   Procedure: SHOULDER ARTHROSCOPY WITH ROTATOR CUFF REPAIR AND SUBACROMIAL DECOMPRESSION;  Surgeon: Johnn Hai, MD;  Location: [facility];  Service: Orthopedics;  Laterality: Left;  Left Shoulder arthroscopy subacromial decompression mini open rotator cuff repair    VENTRICULOPERITONEAL SHUNT     Social History: Social History   Socioeconomic History   Marital status: Married    Spouse name: Not on file   Number of children: Not on file   Years of education: Not on file   Highest education level: Not on file  Occupational History   Not on file  Tobacco Use   Smoking status: Former    Packs/day: 1.00    Years: 20.00    Total pack years: 20.00    Types: Cigarettes   Smokeless tobacco: Never   Tobacco comments:    QUIT SMOKING 2011, has restarted   Vaping Use   Vaping Use: Never used  Substance and Sexual Activity   Alcohol use: No   Drug use: No   Sexual activity: Not on file  Other Topics Concern   Not on file  Social History Narrative   Not on file   Social Determinants of Health   Financial Resource Strain: Not on file  Food Insecurity: Not on file  Transportation Needs: Not on file  Physical Activity: Not on file  Stress: Not on file  Social Connections: Not on file   Family History: Family History  Problem Relation Age of Onset    Diabetes Father    CAD Father    Hyperlipidemia Father    Arthritis Father    Arthritis Sister    Allergies: Allergies  Allergen Reactions   Bee Pollen Other (See Comments)    syncope syncope   Bee Venom Other (See Comments)    Numbness and syncope *fell x2 and muscles wouldn't work well, 21 Mar 2005   Medications: See med rec.  Review of Systems: No headache, visual changes, nausea, vomiting, diarrhea, constipation, dizziness, abdominal pain, skin rash, fevers, chills, night sweats, swollen lymph nodes, weight loss, chest pain, body aches, joint swelling, muscle aches, shortness of breath, mood changes, visual or auditory hallucinations.  Objective:    General: Well Developed, well nourished, and in no acute distress.  Neuro: Alert and oriented x3, extra-ocular muscles intact, sensation grossly intact. Cranial nerves II through XII are intact, motor, sensory, and coordinative functions are all intact. HEENT: Normocephalic, atraumatic, pupils equal round reactive to light, neck supple, no masses, no lymphadenopathy, thyroid nonpalpable. Oropharynx, nasopharynx, external ear canals are unremarkable. Skin: Warm and dry, no rashes noted.  Cardiac: Regular rate and rhythm, no murmurs rubs or gallops.  Respiratory: Clear to auscultation bilaterally. Not using accessory muscles, speaking in full sentences.  Abdominal: Soft, nontender, nondistended, positive bowel sounds, no masses, no organomegaly.  Musculoskeletal: Shoulder, elbow, wrist, hip, knee, ankle stable, and with full range of motion.  Impression and Recommendations:    The patient was counselled, risk factors were discussed, anticipatory guidance given.  Annual physical exam Annual physical as above, checking routine labs today. Up-to-date on vaccinations, he did get Shingrix and will send [place] his dates and a MyChart message. He does need annual lung cancer screening chest CT. Return to see me in 1 year for annual  physical.  Former smoker Has quit smoking. We will continue annual low-dose chest CT screening until he has been clear of smoking for 15 years.  Mild CAD Refilling nitroglycerin, understands not to use this with Cialis.   ____________________________________________ [name]. Benjamin Stain, M.D., ABFM., CAQSM., AME. Primary Care and Sports Medicine [facility]  Adjunct Professor of Family Medicine  [employer]  Restaurant manager, fast food

## 2022-05-09 NOTE — Assessment & Plan Note (Addendum)
Has quit smoking. We will continue annual low-dose chest CT screening until he has been clear of smoking for 15 years.

## 2022-05-09 NOTE — Assessment & Plan Note (Signed)
Annual physical as above, checking routine labs today. Up-to-date on vaccinations, he did get Shingrix and will send [place] his dates and a MyChart message. He does need annual lung cancer screening chest CT. Return to see me in 1 year for annual physical.

## 2022-05-10 ENCOUNTER — Other Ambulatory Visit: Payer: Self-pay

## 2022-05-10 ENCOUNTER — Encounter: Payer: Self-pay | Admitting: Cardiology

## 2022-05-10 DIAGNOSIS — M170 Bilateral primary osteoarthritis of knee: Secondary | ICD-10-CM

## 2022-05-10 LAB — PSA, TOTAL AND FREE
PSA, % Free: 100 % (calc) (ref >25)
PSA, Free: 0.1 ng/mL
PSA, Total: 0.1 ng/mL (ref <=4.0)

## 2022-05-10 LAB — COMPLETE METABOLIC PANEL WITH GFR
AG Ratio: 2.1 (calc) (ref 1.0–2.5)
ALT: 26 U/L (ref 9–46)
AST: 24 U/L (ref 10–35)
Albumin: 4.7 g/dL (ref 3.6–5.1)
Alkaline phosphatase (APISO): 58 U/L (ref 35–144)
BUN: 18 mg/dL (ref 7–25)
CO2: 28 mmol/L (ref 20–32)
Calcium: 9.5 mg/dL (ref 8.6–10.3)
Chloride: 104 mmol/L (ref 98–110)
Creat: 0.79 mg/dL (ref 0.70–1.30)
Globulin: 2.2 g/dL (calc) (ref 1.9–3.7)
Glucose, Bld: 97 mg/dL (ref 65–99)
Potassium: 4.6 mmol/L (ref 3.5–5.3)
Sodium: 138 mmol/L (ref 135–146)
Total Bilirubin: 0.6 mg/dL (ref 0.2–1.2)
Total Protein: 6.9 g/dL (ref 6.1–8.1)
eGFR: 108 mL/min/{1.73_m2} (ref >=60)

## 2022-05-10 LAB — LIPID PANEL
Cholesterol: 94 mg/dL (ref <200)
HDL: 45 mg/dL (ref >=40)
LDL Cholesterol (Calc): 31 mg/dL (calc)
Non-HDL Cholesterol (Calc): 49 mg/dL (calc) (ref <130)
Total CHOL/HDL Ratio: 2.1 (calc) (ref <5.0)
Triglycerides: 99 mg/dL (ref <150)

## 2022-05-10 LAB — CBC
HCT: 45.4 % (ref 38.5–50.0)
Hemoglobin: 15.6 g/dL (ref 13.2–17.1)
MCH: 32.4 pg (ref 27.0–33.0)
MCHC: 34.4 g/dL (ref 32.0–36.0)
MCV: 94.4 fL (ref 80.0–100.0)
MPV: 10.7 fL (ref 7.5–12.5)
Platelets: 213 10*3/uL (ref 140–400)
RBC: 4.81 10*6/uL (ref 4.20–5.80)
RDW: 12.5 % (ref 11.0–15.0)
WBC: 5.4 10*3/uL (ref 3.8–10.8)

## 2022-05-10 LAB — HEMOGLOBIN A1C
Hgb A1c MFr Bld: 6 % of total Hgb — ABNORMAL HIGH (ref <5.7)
Mean Plasma Glucose: 126 mg/dL
eAG (mmol/L): 7 mmol/L

## 2022-05-10 LAB — TSH: TSH: 1.55 mIU/L (ref 0.40–4.50)

## 2022-05-10 MED ORDER — CELECOXIB 200 MG PO CAPS: ORAL_CAPSULE | ORAL | 0 refills | Status: DC

## 2022-05-10 NOTE — Telephone Encounter (Signed)
Fax refill request from [facility]

## 2022-05-15 ENCOUNTER — Ambulatory Visit: Payer: 59 | Admitting: Sports Medicine

## 2022-05-17 ENCOUNTER — Ambulatory Visit (INDEPENDENT_AMBULATORY_CARE_PROVIDER_SITE_OTHER): Payer: 59

## 2022-05-17 ENCOUNTER — Other Ambulatory Visit: Payer: Self-pay

## 2022-05-17 DIAGNOSIS — Z87891 Personal history of nicotine dependence: Secondary | ICD-10-CM

## 2022-05-17 MED ORDER — TAMSULOSIN HCL 0.4 MG PO CAPS: 0.8000 mg | ORAL_CAPSULE | Freq: Every day | ORAL | 1 refills | Status: DC

## 2022-05-17 NOTE — Telephone Encounter (Signed)
Pt called for a refill

## 2022-05-20 ENCOUNTER — Ambulatory Visit (HOSPITAL_COMMUNITY)
Admission: RE | Admit: 2022-05-20 | Discharge: 2022-05-20 | Disposition: A | Discharge status: Home / Self Care | Payer: 59 | Source: Ambulatory Visit | Attending: Cardiology | Admitting: Cardiology

## 2022-05-20 ENCOUNTER — Ambulatory Visit (INDEPENDENT_AMBULATORY_CARE_PROVIDER_SITE_OTHER): Payer: 59

## 2022-05-20 ENCOUNTER — Ambulatory Visit (INDEPENDENT_AMBULATORY_CARE_PROVIDER_SITE_OTHER): Payer: 59 | Admitting: Podiatry

## 2022-05-20 DIAGNOSIS — S99191D Other physeal fracture of right metatarsal, subsequent encounter for fracture with routine healing: Secondary | ICD-10-CM | POA: Diagnosis not present

## 2022-05-20 DIAGNOSIS — I6529 Occlusion and stenosis of unspecified carotid artery: Secondary | ICD-10-CM | POA: Diagnosis present

## 2022-05-20 NOTE — Progress Notes (Unsigned)
Subjective: Chief Complaint  Patient presents with   Fracture    Right foot fracture follow-up, patient denies any pain, X-rays taken today, TX: cam boot     States he is doing well, no pain. No swelling. States he has not really needed to ice. He has remained in the CAM boot. He had no injuries which started this. He has high arches and he has had peroneal tendon repair previously due to rupture.   Objective: AAO x3, NAD DP/PT pulses palpable bilaterally, CRT less than 3 seconds  No pain with calf compression, swelling, warmth, erythema  Assessment:  Plan: -All treatment options discussed with the patient including all alternatives, risks, complications.   -Patient encouraged to call the office with any questions, concerns, change in symptoms.

## 2022-05-20 NOTE — Progress Notes (Signed)
VASCULAR LAB    Carotid duplex has been performed.  See CV proc for preliminary results.   Avni Traore, RVT 05/20/2022, 9:56 AM

## 2022-05-21 ENCOUNTER — Ambulatory Visit (HOSPITAL_COMMUNITY): Payer: 59

## 2022-05-30 NOTE — Telephone Encounter (Signed)
Results reviewed with pt as per Dr. Krasowski's note.  Pt verbalized understanding and had no additional questions. Routed to PCP  

## 2022-06-10 ENCOUNTER — Ambulatory Visit (INDEPENDENT_AMBULATORY_CARE_PROVIDER_SITE_OTHER): Payer: 59 | Admitting: Podiatry

## 2022-06-10 ENCOUNTER — Ambulatory Visit: Payer: 59

## 2022-06-10 ENCOUNTER — Other Ambulatory Visit: Payer: Self-pay | Admitting: Podiatry

## 2022-06-10 DIAGNOSIS — S99191D Other physeal fracture of right metatarsal, subsequent encounter for fracture with routine healing: Secondary | ICD-10-CM | POA: Diagnosis not present

## 2022-06-10 NOTE — Progress Notes (Unsigned)
Subjective: Chief Complaint  Patient presents with   Fracture    Patient came in today for a right foot fracture of the fifth base metatarsal bone. Patient denies any pain, TX: Cam boot, X-Rays taken today    He walks without the boot at home and wears a surgical shoe at home. He has no pain, swelling.   Objective: AAO x3, NAD DP/PT pulses palpable bilaterally, CRT less than 3 seconds Not able to appreciate any area of tenderness on the right foot particular in the area of the fifth metatarsal.  Trace edema there is no erythema or warmth.  No pain on the peroneal tendon.  Cavus foot type is present.  MMT 5/5. No pain with calf compression, swelling, warmth, erythema  Assessment: Fifth metatarsal fracture, likely stress fracture  Plan: -All treatment options discussed with the patient including all alternatives, risks, complications.   -Patient encouraged to call the office with any questions, concerns, change in symptoms.   Return in about 3 weeks (around 06/10/2022) for right foot metatarsal fracture, x-ray.  Vivi Barrack DPM

## 2022-07-22 ENCOUNTER — Ambulatory Visit (INDEPENDENT_AMBULATORY_CARE_PROVIDER_SITE_OTHER): Payer: No Typology Code available for payment source | Admitting: Podiatry

## 2022-07-22 ENCOUNTER — Ambulatory Visit (INDEPENDENT_AMBULATORY_CARE_PROVIDER_SITE_OTHER): Payer: No Typology Code available for payment source

## 2022-07-22 VITALS — BP 136/79 | HR 68

## 2022-07-22 DIAGNOSIS — S99191D Other physeal fracture of right metatarsal, subsequent encounter for fracture with routine healing: Secondary | ICD-10-CM | POA: Diagnosis not present

## 2022-07-22 NOTE — Progress Notes (Signed)
Subjective: Chief Complaint  Patient presents with   Fracture    Follw-up: Patient denies pain, swelling and discomfort. He states everything feels good.    52 year old male presents the office for above concerns.  States he is doing well.  No swelling or redness that he reports he has no pain.  He is back to wearing a regular shoe.   Objective: AAO x3, NAD DP/PT pulses palpable bilaterally, CRT less than 3 seconds I am not able to elicit any area of tenderness to proceed with the fifth metatarsal on the fifth metatarsal fracture.  There is no area pinpoint tenderness and no edema.  No tenderness.  There is no other areas of discomfort noted today.  MMT 5/5. No pain with calf compression, swelling, warmth, erythema  Assessment: Fifth metatarsal fracture, improving  Plan: -All treatment options discussed with the patient including all alternatives, risks, complications.  -X-rays obtained reviewed.  3 views were obtained.  There does appear to be some increased consolidation noted across the fracture site.  No significant displacement present. -Again discussed with conservative as well as surgical options.  Overall he is doing better clinically and there is to be some increased consolidation noted on x-ray.  Due to this I do expect this to continue to heal and continue his shoes and good support.  Should he have pain or swelling or other symptoms would consider but for now we will continue to treat this conservatively.  He agrees to this plan.  Vivi Barrack DPM

## 2022-09-01 ENCOUNTER — Other Ambulatory Visit: Payer: Self-pay | Admitting: Sports Medicine

## 2022-09-09 ENCOUNTER — Ambulatory Visit (INDEPENDENT_AMBULATORY_CARE_PROVIDER_SITE_OTHER): Payer: No Typology Code available for payment source

## 2022-09-09 ENCOUNTER — Ambulatory Visit (INDEPENDENT_AMBULATORY_CARE_PROVIDER_SITE_OTHER): Payer: No Typology Code available for payment source | Admitting: Podiatry

## 2022-09-09 DIAGNOSIS — M779 Enthesopathy, unspecified: Secondary | ICD-10-CM

## 2022-09-09 DIAGNOSIS — S99191D Other physeal fracture of right metatarsal, subsequent encounter for fracture with routine healing: Secondary | ICD-10-CM

## 2022-09-09 DIAGNOSIS — M21961 Unspecified acquired deformity of right lower leg: Secondary | ICD-10-CM | POA: Diagnosis not present

## 2022-09-09 DIAGNOSIS — M19071 Primary osteoarthritis, right ankle and foot: Secondary | ICD-10-CM

## 2022-09-09 NOTE — Patient Instructions (Signed)
8 today

## 2022-09-09 NOTE — Progress Notes (Signed)
Subjective: Chief Complaint  Patient presents with   Fracture    metatarsal fracture    52 year old male presents the office for above concerns.  States he is getting some soreness on the right foot.  Not sure if there is arthritis or been else going on as well.  Possible custom orthotics.    Objective: AAO x3, NAD DP/PT pulses palpable bilaterally, CRT less than 3 seconds There is mild tenderness palpation at the plantar sulcus on the area of fracture as well as mildly to the dorsal midfoot but no specific area pinpoint tenderness noted otherwise.  There is no edema, erythema.  Flexor, extensor tendons appear to be intact.  MMT 5/5.Marland Kitchen No pain with calf compression, swelling, warmth, erythema  Assessment: Fifth metatarsal fracture, improving; arthritis   Plan: -All treatment options discussed with the patient including all alternatives, risks, complications.  -X-rays obtained reviewed.  3 views were obtained.  There does appear to be some increased consolidation noted across the fracture site.  No significant displacement present.  There is no evidence of acute fracture noted otherwise.  Arthritic changes present to the Lisfranc joint as well as mildly to the metatarsophalangeal joint on only a previous surgery. -The fracture seems to be healing but I think it foot structure as well as history of previous stress fractures he has had will benefit from orthotics.  Measured for custom orthotics today.  Continue supportive shoe gear.  Compression anklet dispensed to help if needed as well.  Return in about 6 weeks (around 10/21/2022).  X-ray next appointment Trula Slade DPM

## 2022-09-09 NOTE — Progress Notes (Signed)
Patient presents today to be casted for custom molded orthotics. Jacqualyn Posey is the treating physician.  Impression foam cast was taken. ABN signed.  Patient info-  Shoe size: 9W  Shoe style: ATHLETIC/BOOTS  Height: 5FT 7IN  Weight: 185 LBS  Insurance: [place] [employer]   Patient will be notified once orthotics arrive in office and reappoint for fitting at that time.

## 2022-10-02 ENCOUNTER — Other Ambulatory Visit: Payer: Self-pay | Admitting: Sports Medicine

## 2022-10-02 DIAGNOSIS — M170 Bilateral primary osteoarthritis of knee: Secondary | ICD-10-CM

## 2022-10-19 ENCOUNTER — Other Ambulatory Visit: Payer: Self-pay | Admitting: Cardiology

## 2022-10-19 ENCOUNTER — Other Ambulatory Visit: Payer: Self-pay | Admitting: Sports Medicine

## 2022-10-21 ENCOUNTER — Other Ambulatory Visit: Payer: Self-pay | Admitting: Sports Medicine

## 2022-10-28 ENCOUNTER — Encounter: Payer: Self-pay | Admitting: *Deleted

## 2022-11-03 ENCOUNTER — Other Ambulatory Visit: Payer: Self-pay | Admitting: Sports Medicine

## 2022-11-03 DIAGNOSIS — M170 Bilateral primary osteoarthritis of knee: Secondary | ICD-10-CM

## 2022-11-04 MED ORDER — CELECOXIB 200 MG PO CAPS: ORAL_CAPSULE | ORAL | 1 refills | Status: DC

## 2022-11-11 ENCOUNTER — Ambulatory Visit (INDEPENDENT_AMBULATORY_CARE_PROVIDER_SITE_OTHER): Payer: No Typology Code available for payment source | Admitting: Podiatry

## 2022-11-11 ENCOUNTER — Ambulatory Visit (INDEPENDENT_AMBULATORY_CARE_PROVIDER_SITE_OTHER): Payer: No Typology Code available for payment source

## 2022-11-11 DIAGNOSIS — S99191D Other physeal fracture of right metatarsal, subsequent encounter for fracture with routine healing: Secondary | ICD-10-CM

## 2022-11-11 NOTE — Progress Notes (Signed)
Subjective: Chief Complaint  Patient presents with   Fracture    Stress fracture, Pick up orthotics    52 year old male presents the office for above concerns.  States he is doing well not having any pain to his foot.  No swelling.  Is doing regular activities in regular shoes without any discomfort.  No new concerns.  He also presents to pick up orthotics.     Objective: AAO x3, NAD DP/PT pulses palpable bilaterally, CRT less than 3 seconds Unable to elicit any.  Pinpoint tenderness particularly on the area the fifth metatarsal base on the right foot.  There is no edema.  There is no erythema.  Flexor, extensor tendons appear to be intact.  MMT 5/5. No pain with calf compression, swelling, warmth, erythema  Assessment: Fifth metatarsal fracture, improving; arthritis   Plan: -All treatment options discussed with the patient including all alternatives, risks, complications.  -X-rays obtained reviewed.  3 views were obtained.  Radiolucency still noted along the fifth metatarsal base.  There is some consolidation noted.  No evidence of acute fracture otherwise. 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  I discussed the x-ray findings with him.  Continue shoes with good support.  Orthotics were dispensed and break-in instructions provided.  Should he have any symptoms or any changes to let me know.  He understands and agrees with plan.  Vivi Barrack DPM

## 2022-11-11 NOTE — Patient Instructions (Signed)

## 2023-01-26 ENCOUNTER — Other Ambulatory Visit: Payer: Self-pay | Admitting: Sports Medicine

## 2023-01-26 DIAGNOSIS — F17200 Nicotine dependence, unspecified, uncomplicated: Secondary | ICD-10-CM

## 2023-02-16 ENCOUNTER — Other Ambulatory Visit: Payer: Self-pay | Admitting: Sports Medicine

## 2023-02-20 ENCOUNTER — Other Ambulatory Visit: Payer: Self-pay | Admitting: Sports Medicine

## 2023-02-20 DIAGNOSIS — I2089 Other forms of angina pectoris: Secondary | ICD-10-CM

## 2023-04-16 ENCOUNTER — Other Ambulatory Visit: Payer: Self-pay | Admitting: Cardiology

## 2023-04-24 ENCOUNTER — Other Ambulatory Visit: Payer: Self-pay | Admitting: Cardiology

## 2023-04-24 DIAGNOSIS — E785 Hyperlipidemia, unspecified: Secondary | ICD-10-CM

## 2023-04-24 DIAGNOSIS — I25118 Atherosclerotic heart disease of native coronary artery with other forms of angina pectoris: Secondary | ICD-10-CM

## 2023-04-24 NOTE — Telephone Encounter (Signed)
Rx refill sent to pharmacy. 

## 2023-04-29 ENCOUNTER — Other Ambulatory Visit: Payer: Self-pay | Admitting: Sports Medicine

## 2023-04-29 DIAGNOSIS — I2089 Other forms of angina pectoris: Secondary | ICD-10-CM

## 2023-05-13 ENCOUNTER — Other Ambulatory Visit: Payer: Self-pay | Admitting: Cardiology

## 2023-05-13 ENCOUNTER — Ambulatory Visit (INDEPENDENT_AMBULATORY_CARE_PROVIDER_SITE_OTHER): Payer: No Typology Code available for payment source | Admitting: Sports Medicine

## 2023-05-13 ENCOUNTER — Encounter: Payer: Self-pay | Admitting: Sports Medicine

## 2023-05-13 VITALS — BP 105/70 | HR 70 | Ht 67.0 in | Wt 177.0 lb

## 2023-05-13 DIAGNOSIS — M170 Bilateral primary osteoarthritis of knee: Secondary | ICD-10-CM

## 2023-05-13 DIAGNOSIS — Z982 Presence of cerebrospinal fluid drainage device: Secondary | ICD-10-CM

## 2023-05-13 DIAGNOSIS — E782 Mixed hyperlipidemia: Secondary | ICD-10-CM

## 2023-05-13 DIAGNOSIS — R7303 Prediabetes: Secondary | ICD-10-CM | POA: Diagnosis not present

## 2023-05-13 DIAGNOSIS — Z23 Encounter for immunization: Secondary | ICD-10-CM

## 2023-05-13 DIAGNOSIS — Z0000 Encounter for general adult medical examination without abnormal findings: Secondary | ICD-10-CM

## 2023-05-13 DIAGNOSIS — Z87891 Personal history of nicotine dependence: Secondary | ICD-10-CM | POA: Diagnosis not present

## 2023-05-13 MED ORDER — DICLOFENAC SODIUM 1 % EX GEL: 4.0000 g | Freq: Four times a day (QID) | CUTANEOUS | 11 refills | Status: AC

## 2023-05-13 NOTE — Assessment & Plan Note (Addendum)
Known knee osteoarthritis, he is getting some mechanical symptoms in the right knee. We did Synvisc back in 2019, then we were unable to get it approved due to a high deductible. We did [facility] lab PRP injection #1 back in 2022, he had a bit of discomfort, we have not treated it since 2022. He will continue with Celebrex for now, adding topical Voltaren. If pain becomes intractable or mechanical symptoms become intractable he will let me know and we can proceed with advanced imaging.

## 2023-05-13 NOTE — Patient Instructions (Signed)

## 2023-05-13 NOTE — Assessment & Plan Note (Signed)
Right-sided ventriculoperitoneal shunt palpable on exam. He typically needs a shunt check after MRIs.

## 2023-05-13 NOTE — Progress Notes (Signed)
Subjective:    CC: Annual Physical Exam  HPI:  This patient is here for their annual physical  I reviewed the past medical history, family history, social history, surgical history, and allergies today and no changes were needed.  Please see the problem list section below in epic for further details.  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 Bee sting   History of right peroneus brevis tear post repair 07/26/2014   Hyperlipidemia 07/26/2014   Impingement syndrome of right shoulder 03/01/2019   Lumbar facet arthropathy 02/23/2018   Lumbar facet joint syndrome 04/11/2016   Mild CAD 12/24/2017   Obstructive uropathy 03/19/2016   Osteoarthritis of both knees 07/26/2014   Postarthroscopy and partial meniscectomy on the right. Unloader brace on the right. Status post anterior cruciate ligament repair with reconstruction on the left.  Orthovisc approved    Other and unspecified hyperlipidemia 03/21/2014   Formatting of this note might be different from the original. Diet controlled   Pain    LEFT SHOULDER- ROTATOR CUFF  TEAR   Primary osteoarthritis of both knees 07/26/2014   Postarthroscopy and partial meniscectomy on the right. Unloader brace on the right. Status post anterior cruciate ligament repair with reconstruction on the left.  Orthovisc approved    Primary osteoarthritis of first carpometacarpal joint of right hand 06/04/2016   Primary osteoarthritis of right foot 08/16/2016   Right foot pain 03/01/2019   Rotator cuff tear, left 06/18/2012   S/P ventriculoperitoneal shunt 07/26/2014   Smoker 08/19/2017   Stable angina (HCC) 08/19/2017   Tinea pedis, left 05/22/2018   VP  (ventriculoperitoneal) shunt status 2009   FOR OBSTRUCTIVE HYDROCEHALUS - CAUSED BY COLLOID BRAIN CYST ( CYST NOT OPERTIVE )   Yellow jacket sting allergy    Past Surgical History: Past Surgical History:  Procedure Laterality Date   ANTERIOR CRUCIATE LIGAMENT REPAIR  1997 OR 1998   LEFT KNEE   LEFT ANKLE TENDON TRANSFER  FEB 2010   RIGHT ANKLE TENDON TRANSFER  SEPT 30, 2013   SURGERY [facility] DONE IN [facility]--PT WEARS  ORTHOTIC BOOT WHEN AMBULATING--HEALING INCISION--SOME WEAKNESS STILL   RIGHT WRIST FIXATION AUG  2006     SHOULDER ARTHROSCOPY WITH ROTATOR CUFF REPAIR AND SUBACROMIAL DECOMPRESSION  06/18/2012   Procedure: SHOULDER ARTHROSCOPY WITH ROTATOR CUFF REPAIR AND SUBACROMIAL DECOMPRESSION;  Surgeon: Javier Docker, MD;  Location: [facility];  Service: Orthopedics;  Laterality: Left;  Left Shoulder arthroscopy subacromial decompression mini open rotator cuff repair    VENTRICULOPERITONEAL SHUNT     Social History: Social History   Socioeconomic History   Marital status: Married    Spouse name: Not on file   Number of children: Not on file   Years of education: Not on file   Highest education level: Not on file  Occupational History   Not on file  Tobacco Use   Smoking status: Former    Current packs/day: 1.00    Average packs/day: 1 pack/day for 20.0 years (20.0 ttl pk-yrs)    Types: Cigarettes   Smokeless tobacco: Never   Tobacco comments:    QUIT SMOKING 2011, has restarted   Vaping Use   Vaping status:  Never Used  Substance and Sexual Activity   Alcohol use: No   Drug use: No   Sexual activity: Not on file  Other Topics Concern   Not on file  Social History Narrative   Not on file   Social Determinants of Health   Financial Resource Strain: Low Risk  (05/09/2023)   [facility]   Overall Financial Resource Strain (CARDIA)    Difficulty of Paying Living Expenses: Not hard at all  Food Insecurity: No Food Insecurity (05/09/2023)    Received from [facility]   Hunger Vital Sign    Worried About Running Out of Food in the Last Year: Never true    Ran Out of Food in the Last Year: Never true  Transportation Needs: No Transportation Needs (05/09/2023)   Received from [facility] - Transportation    Lack of Transportation (Medical): No    Lack of Transportation (Non-Medical): No  Physical Activity: Not on file  Stress: Not on file  Social Connections: Unknown (03/11/2023)   Received from [facility]   Social Network    Social Network: Not on file   Family History: Family History  Problem Relation Age of Onset   Diabetes Father    CAD Father    Hyperlipidemia Father    Arthritis Father    Arthritis Sister    Allergies: Allergies  Allergen Reactions   Bee Pollen Other (See Comments)    syncope syncope   Bee Venom Other (See Comments)    Numbness and syncope *fell x2 and muscles wouldn't work well, 21 Mar 2005   Medications: See med rec.  Review of Systems: No headache, visual changes, nausea, vomiting, diarrhea, constipation, dizziness, abdominal pain, skin rash, fevers, chills, night sweats, swollen lymph nodes, weight loss, chest pain, body aches, joint swelling, muscle aches, shortness of breath, mood changes, visual or auditory hallucinations.  Objective:    General: Well Developed, well nourished, and in no acute distress.  Neuro: Alert and oriented x3, extra-ocular muscles intact, sensation grossly intact. Cranial nerves II through XII are intact, motor, sensory, and coordinative functions are all intact. HEENT: Normocephalic, atraumatic, pupils equal round reactive to light, neck supple, no masses, no lymphadenopathy, thyroid nonpalpable. Oropharynx, nasopharynx, external ear canals are unremarkable.  VP shunt palpable right side of the neck. Skin: Warm and dry, no rashes noted.  Cardiac: Regular rate and rhythm, no murmurs rubs or gallops.  Respiratory: Clear to auscultation  bilaterally. Not using accessory muscles, speaking in full sentences.  Abdominal: Soft, nontender, nondistended, positive bowel sounds, no masses, no organomegaly.  Musculoskeletal: Shoulder, elbow, wrist, hip, knee, ankle stable, and with full range of motion.  Bony callus palpable fifth metatarsal shaft right foot.  Impression and Recommendations:    The patient was counselled, risk factors were discussed, anticipatory guidance given.  Annual physical exam Fasting annual physical. Flu shot today. Adding annual lung cancer screening chest CT. Return in 1 year.  Prediabetes Last hemoglobin A1c was 6%. Rechecking, also instituting a Mediterranean diet.   Primary osteoarthritis of both knees Known knee osteoarthritis, he is getting some mechanical symptoms in the right knee. We did Synvisc back in 2019, then we were unable to get it approved due to a high deductible. We did Regenlab lab PRP injection #1 back in 2022, he had a bit of discomfort, we have not treated it since 2022. He will continue with Celebrex for now, adding topical Voltaren. If pain becomes intractable or mechanical symptoms  become intractable he will let me know and we can proceed with advanced imaging.  VP (ventriculoperitoneal) shunt status Right-sided ventriculoperitoneal shunt palpable on exam. He typically needs a shunt check after MRIs.   ____________________________________________ [name]. Benjamin Stain, M.D., ABFM., CAQSM., AME. Primary Care and Sports Medicine [facility]  Adjunct Professor of Family Medicine  [employer]  Restaurant manager, fast food

## 2023-05-13 NOTE — Assessment & Plan Note (Signed)
Last hemoglobin A1c was 6%. Rechecking, also instituting a Mediterranean diet.

## 2023-05-13 NOTE — Assessment & Plan Note (Signed)
Fasting annual physical. Flu shot today. Adding annual lung cancer screening chest CT. Return in 1 year.

## 2023-05-14 LAB — COMPREHENSIVE METABOLIC PANEL
ALT: 30 [IU]/L (ref 0–44)
AST: 29 [IU]/L (ref 0–40)
Albumin: 4.5 g/dL (ref 3.8–4.9)
Alkaline Phosphatase: 73 [IU]/L (ref 44–121)
BUN/Creatinine Ratio: 19 (ref 9–20)
BUN: 16 mg/dL (ref 6–24)
Bilirubin Total: 0.4 mg/dL (ref 0.0–1.2)
CO2: 24 mmol/L (ref 20–29)
Calcium: 9.6 mg/dL (ref 8.7–10.2)
Chloride: 103 mmol/L (ref 96–106)
Creatinine, Ser: 0.83 mg/dL (ref 0.76–1.27)
Globulin, Total: 2.1 g/dL (ref 1.5–4.5)
Glucose: 96 mg/dL (ref 70–99)
Potassium: 4.6 mmol/L (ref 3.5–5.2)
Sodium: 140 mmol/L (ref 134–144)
Total Protein: 6.6 g/dL (ref 6.0–8.5)
eGFR: 105 mL/min/{1.73_m2} (ref >59)

## 2023-05-14 LAB — LIPID PANEL
Chol/HDL Ratio: 2.5 ratio (ref 0.0–5.0)
Cholesterol, Total: 103 mg/dL (ref 100–199)
HDL: 41 mg/dL (ref >39)
LDL Chol Calc (NIH): 40 mg/dL (ref 0–99)
Triglycerides: 121 mg/dL (ref 0–149)
VLDL Cholesterol Cal: 22 mg/dL (ref 5–40)

## 2023-05-14 LAB — CBC
Hematocrit: 45.8 % (ref 37.5–51.0)
Hemoglobin: 15 g/dL (ref 13.0–17.7)
MCH: 31.7 pg (ref 26.6–33.0)
MCHC: 32.8 g/dL (ref 31.5–35.7)
MCV: 97 fL (ref 79–97)
Platelets: 223 10*3/uL (ref 150–450)
RBC: 4.73 x10E6/uL (ref 4.14–5.80)
RDW: 12.6 % (ref 11.6–15.4)
WBC: 8.1 10*3/uL (ref 3.4–10.8)

## 2023-05-14 LAB — HEMOGLOBIN A1C
Est. average glucose Bld gHb Est-mCnc: 128 mg/dL
Hgb A1c MFr Bld: 6.1 % — ABNORMAL HIGH (ref 4.8–5.6)

## 2023-05-14 LAB — TSH: TSH: 1.59 u[IU]/mL (ref 0.450–4.500)

## 2023-05-23 ENCOUNTER — Other Ambulatory Visit: Payer: Self-pay

## 2023-05-23 DIAGNOSIS — I25118 Atherosclerotic heart disease of native coronary artery with other forms of angina pectoris: Secondary | ICD-10-CM

## 2023-05-23 DIAGNOSIS — E785 Hyperlipidemia, unspecified: Secondary | ICD-10-CM

## 2023-05-23 MED ORDER — EZETIMIBE 10 MG PO TABS: 10.0000 mg | ORAL_TABLET | Freq: Every day | ORAL | 0 refills | Status: AC

## 2023-05-26 ENCOUNTER — Ambulatory Visit: Payer: No Typology Code available for payment source

## 2023-05-26 DIAGNOSIS — Z122 Encounter for screening for malignant neoplasm of respiratory organs: Secondary | ICD-10-CM | POA: Diagnosis not present

## 2023-05-26 DIAGNOSIS — Z87891 Personal history of nicotine dependence: Secondary | ICD-10-CM

## 2023-06-04 ENCOUNTER — Other Ambulatory Visit: Payer: Self-pay | Admitting: Cardiology

## 2023-06-16 ENCOUNTER — Other Ambulatory Visit: Payer: Self-pay | Admitting: Cardiology

## 2023-06-16 DIAGNOSIS — I25118 Atherosclerotic heart disease of native coronary artery with other forms of angina pectoris: Secondary | ICD-10-CM

## 2023-06-16 DIAGNOSIS — E785 Hyperlipidemia, unspecified: Secondary | ICD-10-CM

## 2023-07-13 ENCOUNTER — Other Ambulatory Visit: Payer: Self-pay | Admitting: Cardiology

## 2023-07-13 ENCOUNTER — Other Ambulatory Visit: Payer: Self-pay | Admitting: Sports Medicine

## 2023-07-14 ENCOUNTER — Other Ambulatory Visit: Payer: Self-pay | Admitting: Cardiology

## 2023-08-05 ENCOUNTER — Other Ambulatory Visit: Payer: Self-pay | Admitting: Sports Medicine

## 2023-08-05 DIAGNOSIS — M170 Bilateral primary osteoarthritis of knee: Secondary | ICD-10-CM

## 2023-10-09 ENCOUNTER — Other Ambulatory Visit: Payer: Self-pay | Admitting: Sports Medicine

## 2023-10-09 DIAGNOSIS — F17200 Nicotine dependence, unspecified, uncomplicated: Secondary | ICD-10-CM

## 2023-11-07 ENCOUNTER — Encounter: Payer: Self-pay | Admitting: Sports Medicine

## 2023-11-07 MED ORDER — NITROGLYCERIN 0.2 MG/HR TD PT24: MEDICATED_PATCH | TRANSDERMAL | 11 refills | Status: DC

## 2023-11-09 ENCOUNTER — Other Ambulatory Visit: Payer: Self-pay | Admitting: Sports Medicine

## 2023-11-09 DIAGNOSIS — M170 Bilateral primary osteoarthritis of knee: Secondary | ICD-10-CM

## 2024-02-16 ENCOUNTER — Other Ambulatory Visit: Payer: Self-pay | Admitting: Sports Medicine

## 2024-02-16 DIAGNOSIS — M170 Bilateral primary osteoarthritis of knee: Secondary | ICD-10-CM

## 2024-03-16 ENCOUNTER — Encounter: Payer: Self-pay | Admitting: Sports Medicine

## 2024-03-25 ENCOUNTER — Encounter

## 2024-03-25 ENCOUNTER — Encounter: Payer: Self-pay | Admitting: Urgent Care

## 2024-05-13 ENCOUNTER — Encounter: Payer: Self-pay | Admitting: Urgent Care

## 2024-05-13 ENCOUNTER — Ambulatory Visit: Payer: No Typology Code available for payment source | Admitting: Urgent Care

## 2024-05-13 VITALS — BP 116/63 | HR 68 | Ht 67.0 in | Wt 174.0 lb

## 2024-05-13 DIAGNOSIS — Z1159 Encounter for screening for other viral diseases: Secondary | ICD-10-CM

## 2024-05-13 DIAGNOSIS — Z0000 Encounter for general adult medical examination without abnormal findings: Secondary | ICD-10-CM

## 2024-05-13 DIAGNOSIS — N401 Enlarged prostate with lower urinary tract symptoms: Secondary | ICD-10-CM

## 2024-05-13 DIAGNOSIS — Z1212 Encounter for screening for malignant neoplasm of rectum: Secondary | ICD-10-CM

## 2024-05-13 DIAGNOSIS — R634 Abnormal weight loss: Secondary | ICD-10-CM

## 2024-05-13 DIAGNOSIS — M170 Bilateral primary osteoarthritis of knee: Secondary | ICD-10-CM

## 2024-05-13 DIAGNOSIS — Z23 Encounter for immunization: Secondary | ICD-10-CM

## 2024-05-13 DIAGNOSIS — R7303 Prediabetes: Secondary | ICD-10-CM

## 2024-05-13 DIAGNOSIS — E782 Mixed hyperlipidemia: Secondary | ICD-10-CM

## 2024-05-13 DIAGNOSIS — Z1211 Encounter for screening for malignant neoplasm of colon: Secondary | ICD-10-CM

## 2024-05-13 DIAGNOSIS — Z125 Encounter for screening for malignant neoplasm of prostate: Secondary | ICD-10-CM

## 2024-05-13 DIAGNOSIS — I2510 Atherosclerotic heart disease of native coronary artery without angina pectoris: Secondary | ICD-10-CM

## 2024-05-13 DIAGNOSIS — Z87891 Personal history of nicotine dependence: Secondary | ICD-10-CM

## 2024-05-13 MED ORDER — VARENICLINE TARTRATE 1 MG PO TABS: 1.0000 mg | ORAL_TABLET | Freq: Two times a day (BID) | ORAL | 3 refills | Status: AC

## 2024-05-13 MED ORDER — TADALAFIL 5 MG PO TABS: 5.0000 mg | ORAL_TABLET | Freq: Every day | ORAL | 3 refills | Status: AC

## 2024-05-13 NOTE — Progress Notes (Signed)
 Complete physical exam  Patient: Frederick Ward   DOB: 08/19/1970   53 y.o. Male  MRN: [id number]  Subjective:    Chief Complaint  Patient presents with   Annual Exam    fasting    Frederick Ward is a 53 y.o. male who presents today for a complete physical exam. He reports consuming a general and eliminating sugar diet. PE includes trying to walk a few times a week. He generally feels fairly well. He reports sleeping well. He does have additional problems to discuss today.   Discussed the use of AI scribe software for clinical note transcription with the patient, who gave verbal consent to proceed.  History of Present Illness   Frederick Ward is a 53 year old male who presents for his annual physical exam.  He is experiencing stress due to the responsibilities of caring for his children and parents. He attempts to engage in physical activity by walking a few days a week, but finds it challenging as the weather gets colder due to arthritis. He does not have a specific routine or duration for his walks.  He has been focusing on maintaining a well-rounded diet, emphasizing protein and vegetables, and has significantly reduced sugar intake following a prediabetic lab value last year. He generally sleeps well, aiming for about seven hours per night. He does not have trouble falling asleep but occasionally uses trazodone to aid sleep. He sometimes wakes up once during the night to use the bathroom but can return to sleep without issues.  He has seen an eye doctor and a dentist within the last year and visits a dermatologist annually, with no current skin concerns. He undergoes annual lung cancer screening with a CT scan. He is due for his next screening in a few weeks.  He is a former smoker but experiences cravings due to his mother's heavy smoking. He smoked briefly after his father's passing but has since quit. He would like to restart chantix  to help with this. He took in the past and  tolerated well with no ADRs.  Pt is due to colorectal cancer screening.   Additionally, pt reports BPH with worsening Luts. He already take flomax  daily. States that there is sometimes hesitancy and weaker stream. He currently takes cialis  PRN, but would like to consider daily to aid in BPH. He has a script for nitroglycerin , but states he has taken in twice in the past 6 years and has had no angina recently. Pt follows with [facility].      Most recent fall risk assessment:     No data to display           Most recent depression screenings:    05/13/2024    8:43 AM 05/13/2023    8:48 AM  PHQ 2/9 Scores  PHQ - 2 Score 0 0    Vision:Not within last year  and Dental: No current dental problems and Receives regular dental care  Patient Active Problem List   Diagnosis Date Noted   Prediabetes 05/13/2023   Closed nondisplaced fracture of fifth metatarsal bone of right foot 04/12/2022   Stress fracture of fourth metatarsal bone of right foot 04/03/2022   Exercise-induced bronchoconstriction 09/14/2019   Pes cavus of both feet 03/01/2019   Impingement syndrome of right shoulder 03/01/2019   DDD (degenerative disc disease), cervical 05/22/2018   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   Coronary artery calcification of native artery 12/24/2017   Stable angina 08/19/2017  Former smoker 08/19/2017   Primary osteoarthritis of right foot 08/16/2016   Primary osteoarthritis of first carpometacarpal joint of right hand 06/04/2016   Lumbar facet joint syndrome 04/11/2016   Obstructive uropathy 03/19/2016   Primary osteoarthritis of both knees 07/26/2014   History of right peroneus brevis tear post repair 07/26/2014   Annual physical exam 07/26/2014   Osteoarthritis of both knees 07/26/2014   Hyperlipidemia 07/26/2014   Mixed hyperlipidemia 03/21/2014   VP (ventriculoperitoneal) shunt status 2009   Past Medical History:  Diagnosis Date   Annual physical exam 07/26/2014    Chest pain 08/25/2017   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 Bee sting   History of right peroneus brevis tear post repair 07/26/2014   Hyperlipidemia 07/26/2014   Impingement syndrome of right shoulder 03/01/2019   Lumbar facet arthropathy 02/23/2018   Lumbar facet joint syndrome 04/11/2016   Mild CAD 12/24/2017   Obstructive uropathy 03/19/2016   Osteoarthritis of both knees 07/26/2014   Postarthroscopy and partial meniscectomy on the right. Unloader brace on the right. Status post anterior cruciate ligament repair with reconstruction on the left.  Orthovisc approved    Other and unspecified hyperlipidemia 03/21/2014   Formatting of this note might be different from the original. Diet controlled   Pain    LEFT SHOULDER- ROTATOR CUFF  TEAR   Primary osteoarthritis of both knees 07/26/2014   Postarthroscopy and partial meniscectomy on the right. Unloader brace on the right. Status post anterior cruciate ligament repair with reconstruction on the left.  Orthovisc approved    Primary osteoarthritis of first carpometacarpal joint of right hand 06/04/2016   Primary osteoarthritis of right foot 08/16/2016   Right foot pain 03/01/2019   Rotator cuff tear, left 06/18/2012   S/P ventriculoperitoneal shunt 07/26/2014   Smoker 08/19/2017   Stable angina 08/19/2017   Tinea pedis, left 05/22/2018   VP (ventriculoperitoneal) shunt status 2009   FOR OBSTRUCTIVE HYDROCEHALUS - CAUSED BY COLLOID BRAIN CYST ( CYST NOT OPERTIVE )   Yellow jacket sting allergy    Past Surgical History:  Procedure Laterality Date   ANTERIOR CRUCIATE LIGAMENT REPAIR  1997 OR 1998   LEFT KNEE   LEFT ANKLE TENDON TRANSFER  FEB 2010   RIGHT ANKLE TENDON TRANSFER  SEPT 30, 2013   SURGERY WAS DONE IN [facility]--PT WEARS   ORTHOTIC BOOT WHEN AMBULATING--HEALING INCISION--SOME WEAKNESS STILL   RIGHT WRIST FIXATION AUG  2006     SHOULDER ARTHROSCOPY WITH ROTATOR CUFF REPAIR AND SUBACROMIAL DECOMPRESSION  06/18/2012   Procedure: SHOULDER ARTHROSCOPY WITH ROTATOR CUFF REPAIR AND SUBACROMIAL DECOMPRESSION;  Surgeon: Reyes JAYSON Billing, MD;  Location: [facility];  Service: Orthopedics;  Laterality: Left;  Left Shoulder arthroscopy subacromial decompression mini open rotator cuff repair    VENTRICULOPERITONEAL SHUNT     Social History   Tobacco Use   Smoking status: Former    Current packs/day: 1.00    Average packs/day: 1 pack/day for 20.0 years (20.0 ttl pk-yrs)    Types: Cigarettes   Smokeless tobacco: Never   Tobacco comments:    QUIT SMOKING 2011, has restarted   Vaping Use   Vaping status: Never Used  Substance Use Topics   Alcohol use: No   Drug use: No     Patient Care Team: Lowella Folks  L, PA as PCP - General (Physician Assistant) Madilyn Anes, MD as Referring Physician (Neurosurgery) Duwayne Purchase, MD as Consulting Physician (Orthopedic Surgery)   Outpatient Medications Prior to Visit  Medication Sig   aspirin  EC (ASPIRIN  LOW DOSE) 81 MG tablet TAKE 1 TABLET BY MOUTH DAILY  (SWALLOW WHOLE)   celecoxib  (CELEBREX ) 200 MG capsule TAKE 1 OR 2 CAPSULES BY MOUTH DAILY AS NEEDED FOR PAIN   diclofenac  Sodium (VOLTAREN ) 1 % GEL Apply 4 g topically 4 (four) times daily. To affected joint.   ezetimibe  (ZETIA ) 10 MG tablet Take 1 tablet (10 mg total) by mouth daily. Patient needs an appointment for further refills. 2 nd attempt   fish oil-omega-3 fatty acids 1000 MG capsule Take 1 g by mouth daily.   metoprolol  succinate (TOPROL -XL) 50 MG 24 hr tablet TAKE 1 TABLET BY MOUTH DAILY  WITH OR IMMEDIATELY FOLLOWING A  MEAL   Multiple Vitamin (MULTIVITAMIN WITH MINERALS) TABS Take 1 tablet by mouth daily.   rosuvastatin  (CRESTOR ) 20 MG tablet TAKE 1 TABLET BY MOUTH DAILY   tamsulosin  (FLOMAX ) 0.4 MG CAPS capsule TAKE  2 CAPSULES BY MOUTH DAILY AFTER BREAKFAST   [DISCONTINUED] nitroGLYCERIN  (NITRODUR - DOSED IN MG/24 HR) 0.2 mg/hr patch Cut and apply 1/4 patch to most painful area q24h. do not use with oral nitroglycerin    [DISCONTINUED] nitroGLYCERIN  (NITROSTAT ) 0.4 MG SL tablet Place 1 tablet (0.4 mg total) under the tongue every 5 (five) minutes as needed for chest pain.   [DISCONTINUED] tadalafil  (CIALIS ) 10 MG tablet TAKE ONE TABLET BY MOUTH DAILY (Patient taking differently: Take 10 mg by mouth daily as needed for erectile dysfunction.)   EPINEPHrine  0.3 mg/0.3 mL IJ SOAJ injection Inject 0.3 mg into the muscle as needed for anaphylaxis. (Patient not taking: Reported on 05/13/2024)   [DISCONTINUED] varenicline  (CHANTIX ) 1 MG tablet TAKE 1 TABLET BY MOUTH TWICE A DAY (Patient not taking: Reported on 05/13/2024)   No facility-administered medications prior to visit.    ROS Complete 12 point ROS performed with all pertinent positives listed in HPI      Objective:     BP 116/63   Pulse 68   Ht 5' 7 (1.702 m)   Wt 174 lb (78.9 kg)   SpO2 98%   BMI 27.25 kg/m  BP Readings from Last 3 Encounters:  05/13/24 116/63  05/13/23 105/70  07/22/22 136/79   Wt Readings from Last 3 Encounters:  05/13/24 174 lb (78.9 kg)  05/13/23 177 lb (80.3 kg)  05/09/22 189 lb (85.7 kg)      Physical Exam Vitals and nursing note reviewed.  Constitutional:      General: He is not in acute distress.    Appearance: Normal appearance. He is not ill-appearing, toxic-appearing or diaphoretic.  HENT:     Head: Normocephalic and atraumatic.     Right Ear: Tympanic membrane, ear canal and external ear normal. There is no impacted cerumen.     Left Ear: Tympanic membrane, ear canal and external ear normal. There is no impacted cerumen.     Nose: Nose normal.     Mouth/Throat:     Mouth: Mucous membranes are moist.     Pharynx: Oropharynx is clear. No oropharyngeal exudate or posterior oropharyngeal erythema.  Eyes:      General: No scleral icterus.       Right eye: No discharge.        Left eye: No discharge.     Extraocular Movements: Extraocular movements intact.  Pupils: Pupils are equal, round, and reactive to light.  Neck:     Thyroid : No thyroid  mass, thyromegaly or thyroid  tenderness.  Cardiovascular:     Rate and Rhythm: Normal rate and regular rhythm.     Pulses: Normal pulses.     Heart sounds: No murmur heard. Pulmonary:     Effort: Pulmonary effort is normal. No respiratory distress.     Breath sounds: Normal breath sounds. No stridor. No wheezing or rhonchi.  Abdominal:     General: Abdomen is flat. Bowel sounds are normal. There is no distension.     Palpations: Abdomen is soft. There is no mass.     Tenderness: There is no abdominal tenderness. There is no guarding.  Musculoskeletal:     Cervical back: Normal range of motion and neck supple. No rigidity or tenderness.     Right lower leg: No edema.     Left lower leg: No edema.  Lymphadenopathy:     Cervical: No cervical adenopathy.  Skin:    General: Skin is warm and dry.     Coloration: Skin is not jaundiced.     Findings: No bruising, erythema or rash.  Neurological:     General: No focal deficit present.     Mental Status: He is alert and oriented to person, place, and time.     Sensory: No sensory deficit.     Motor: No weakness.  Psychiatric:        Mood and Affect: Mood normal.        Behavior: Behavior normal.      No results found for any visits on 05/13/24. Last CBC Lab Results  Component Value Date   WBC 8.1 05/13/2023   HGB 15.0 05/13/2023   HCT 45.8 05/13/2023   MCV 97 05/13/2023   MCH 31.7 05/13/2023   RDW 12.6 05/13/2023   PLT 223 05/13/2023   Last metabolic panel Lab Results  Component Value Date   GLUCOSE 96 05/13/2023   NA 140 05/13/2023   K 4.6 05/13/2023   CL 103 05/13/2023   CO2 24 05/13/2023   BUN 16 05/13/2023   CREATININE 0.83 05/13/2023   EGFR 105 05/13/2023   CALCIUM  9.6  05/13/2023   PROT 6.6 05/13/2023   ALBUMIN 4.5 05/13/2023   LABGLOB 2.1 05/13/2023   AGRATIO 2.5 (H) 12/21/2020   BILITOT 0.4 05/13/2023   ALKPHOS 73 05/13/2023   AST 29 05/13/2023   ALT 30 05/13/2023   Last lipids Lab Results  Component Value Date   CHOL 103 05/13/2023   HDL 41 05/13/2023   LDLCALC 40 05/13/2023   TRIG 121 05/13/2023   CHOLHDL 2.5 05/13/2023   Last hemoglobin A1c Lab Results  Component Value Date   HGBA1C 6.1 (H) 05/13/2023   Last thyroid  functions Lab Results  Component Value Date   TSH 1.590 05/13/2023   Last vitamin D  Lab Results  Component Value Date   VD25OH 37 04/08/2016   Last vitamin B12 and Folate No results found for: VITAMINB12, FOLATE      Assessment & Plan:    Routine Health Maintenance and Physical Exam  Immunization History  Administered Date(s) Administered   Influenza Inj Mdck Quad Pf 04/08/2017   Influenza, Seasonal, Injecte, Preservative Fre 05/13/2023, 05/13/2024   Influenza,inj,Quad PF,6+ Mos 03/27/2018, 04/12/2019, 05/09/2021, 05/09/2022   Moderna Sars-Covid-2 Vaccination 04/27/2020   Tdap 08/20/2017   Zoster Recombinant(Shingrix ) 05/09/2021, 07/18/2021    Health Maintenance  Topic Date Due   Hepatitis C Screening  Never done   Colonoscopy  Never done   Lung Cancer Screening  05/25/2024   COVID-19 Vaccine (2 - Moderna risk series) 05/29/2024 (Originally 05/25/2020)   Pneumococcal Vaccine: 50+ Years (1 of 2 - PCV) 05/13/2025 (Originally 03/20/1990)   Hepatitis B Vaccines 19-59 Average Risk (1 of 3 - 19+ 3-dose series) 05/13/2025 (Originally 03/20/1990)   DTaP/Tdap/Td (2 - Td or Tdap) 08/21/2027   Influenza Vaccine  Completed   HIV Screening  Completed   Zoster Vaccines- Shingrix   Completed   HPV VACCINES  Aged Out   Meningococcal B Vaccine  Aged Out    Discussed health benefits of physical activity, and encouraged him to engage in regular exercise appropriate for his age and condition.  Problem List Items  Addressed This Visit     Mixed hyperlipidemia   Relevant Medications   tadalafil  (CIALIS ) 5 MG tablet   Other Relevant Orders   Lipid panel   Former smoker   Relevant Medications   varenicline  (CHANTIX ) 1 MG tablet   Other Relevant Orders   CT CHEST LUNG CA SCREEN LOW DOSE W/O CM   Coronary artery calcification of native artery   Relevant Medications   tadalafil  (CIALIS ) 5 MG tablet   Primary osteoarthritis of both knees   Annual physical exam - Primary   Relevant Orders   CBC with Differential/Platelet   Hemoglobin A1c   TSH   Lipid panel   Comprehensive metabolic panel with GFR   PSA   Hepatitis C Antibody   Prediabetes   Relevant Orders   Hemoglobin A1c   Other Visit Diagnoses       Immunization due       Relevant Orders   Flu vaccine trivalent PF, 6mos and older(Flulaval,Afluria,Fluarix,Fluzone) (Completed)     Need for hepatitis C screening test       Relevant Orders   Hepatitis C Antibody     Encounter for colorectal cancer screening using Cologuard test       Relevant Orders   Cologuard     Screening for prostate cancer       Relevant Orders   PSA     Weight loss         Benign prostatic hyperplasia with urinary frequency       Relevant Medications   tadalafil  (CIALIS ) 5 MG tablet      Return in about 1 year (around 05/13/2025) for Annual Physical.  Assessment and Plan    Adult Wellness Visit 53 year old male managing stress from caregiving, limited activity due to osteoarthritis, and dietary changes for prediabetes. Occasional trazodone for sleep. Up-to-date with eye and dental check-ups. - Administer flu vaccination. - Discuss colon cancer screening options: Cologuard, colonoscopy. - Discuss DNA test for health assessment.  Osteoarthritis Exacerbated by cold weather, limiting activity. Managed with Celebrex . - Continue Celebrex .  Prediabetes Managed with dietary changes reducing sugar intake. - Continue current dietary  modifications.  Nicotine dependence, in remission Former smoker with cravings due to exposure. Brief relapse after father's passing, currently abstinent. - Discuss potential use of Chantix  for managing cravings.  General Health Maintenance Engages in regular health maintenance. Declines COVID and pneumococcal vaccinations. Requests hepatitis C screening. - Order hepatitis C screening. - Continue annual lung cancer screening with CT.     BPH with LUTS On flomax  daily. Will change PRN cialis  to daily 5mg  cialis . Understands this is contraindicated with nitroglycerin  and thus cannot use.      Benton LITTIE Gave, PA

## 2024-05-13 NOTE — Telephone Encounter (Signed)
 Last read by Manus Dorice Ferraris at 3:44PM on 05/13/2024.

## 2024-05-13 NOTE — Patient Instructions (Signed)
 We completed your annual physical today. Please continue all your medications as ordered. I have called in cialis  5mg . Take this daily to help with prostate symptoms. You CANNOT take nitroglycerin  with this medication. If at any point nitroglycerine is needed, we will need to stop the cialis    Please complete the CT scan of your lungs and the colon screening.  I have called in chantix  for as needed use for cravings.  Follow up in one year, sooner as needed.

## 2024-05-14 ENCOUNTER — Ambulatory Visit: Payer: Self-pay | Admitting: Urgent Care

## 2024-05-14 LAB — CBC WITH DIFFERENTIAL/PLATELET
Basophils Absolute: 0 x10E3/uL (ref 0.0–0.2)
Basos: 0 %
EOS (ABSOLUTE): 0.1 x10E3/uL (ref 0.0–0.4)
Eos: 1 %
Hematocrit: 46.5 % (ref 37.5–51.0)
Hemoglobin: 15.5 g/dL (ref 13.0–17.7)
Immature Grans (Abs): 0 x10E3/uL (ref 0.0–0.1)
Immature Granulocytes: 0 %
Lymphocytes Absolute: 1.7 x10E3/uL (ref 0.7–3.1)
Lymphs: 20 %
MCH: 33 pg (ref 26.6–33.0)
MCHC: 33.3 g/dL (ref 31.5–35.7)
MCV: 99 fL — ABNORMAL HIGH (ref 79–97)
Monocytes Absolute: 0.7 x10E3/uL (ref 0.1–0.9)
Monocytes: 8 %
Neutrophils Absolute: 6.1 x10E3/uL (ref 1.4–7.0)
Neutrophils: 71 %
Platelets: 224 x10E3/uL (ref 150–450)
RBC: 4.7 x10E6/uL (ref 4.14–5.80)
RDW: 12.6 % (ref 11.6–15.4)
WBC: 8.7 x10E3/uL (ref 3.4–10.8)

## 2024-05-14 LAB — COMPREHENSIVE METABOLIC PANEL WITH GFR
ALT: 21 IU/L (ref 0–44)
AST: 26 IU/L (ref 0–40)
Albumin: 4.6 g/dL (ref 3.8–4.9)
Alkaline Phosphatase: 65 IU/L (ref 47–123)
BUN/Creatinine Ratio: 21 — ABNORMAL HIGH (ref 9–20)
BUN: 17 mg/dL (ref 6–24)
Bilirubin Total: 0.4 mg/dL (ref 0.0–1.2)
CO2: 23 mmol/L (ref 20–29)
Calcium: 9.6 mg/dL (ref 8.7–10.2)
Chloride: 99 mmol/L (ref 96–106)
Creatinine, Ser: 0.82 mg/dL (ref 0.76–1.27)
Globulin, Total: 2 g/dL (ref 1.5–4.5)
Glucose: 95 mg/dL (ref 70–99)
Potassium: 4.9 mmol/L (ref 3.5–5.2)
Sodium: 139 mmol/L (ref 134–144)
Total Protein: 6.6 g/dL (ref 6.0–8.5)
eGFR: 105 mL/min/1.73 (ref >59)

## 2024-05-14 LAB — LIPID PANEL
Chol/HDL Ratio: 2.1 ratio (ref 0.0–5.0)
Cholesterol, Total: 90 mg/dL — ABNORMAL LOW (ref 100–199)
HDL: 43 mg/dL (ref >39)
LDL Chol Calc (NIH): 32 mg/dL (ref 0–99)
Triglycerides: 68 mg/dL (ref 0–149)
VLDL Cholesterol Cal: 15 mg/dL (ref 5–40)

## 2024-05-14 LAB — HEPATITIS C ANTIBODY: Hep C Virus Ab: NONREACTIVE

## 2024-05-14 LAB — HEMOGLOBIN A1C
Est. average glucose Bld gHb Est-mCnc: 123 mg/dL
Hgb A1c MFr Bld: 5.9 % — ABNORMAL HIGH (ref 4.8–5.6)

## 2024-05-14 LAB — TSH: TSH: 1.8 u[IU]/mL (ref 0.450–4.500)

## 2024-05-14 LAB — PSA: Prostate Specific Ag, Serum: 0.2 ng/mL (ref 0.0–4.0)

## 2024-05-14 NOTE — Telephone Encounter (Signed)
 I tried deleting the order before singing his office note. I'm not sure I did it correctly however.

## 2024-05-26 ENCOUNTER — Ambulatory Visit

## 2024-05-26 DIAGNOSIS — Z122 Encounter for screening for malignant neoplasm of respiratory organs: Secondary | ICD-10-CM

## 2024-05-26 DIAGNOSIS — Z87891 Personal history of nicotine dependence: Secondary | ICD-10-CM

## 2024-05-27 ENCOUNTER — Other Ambulatory Visit: Payer: Self-pay

## 2024-05-27 MED ORDER — TAMSULOSIN HCL 0.4 MG PO CAPS: 0.8000 mg | ORAL_CAPSULE | Freq: Every day | ORAL | 1 refills | Status: AC

## 2024-06-03 ENCOUNTER — Encounter: Payer: Self-pay | Admitting: Urgent Care

## 2024-06-03 DIAGNOSIS — I2089 Other forms of angina pectoris: Secondary | ICD-10-CM

## 2024-06-05 MED ORDER — METOPROLOL SUCCINATE ER 50 MG PO TB24: ORAL_TABLET | ORAL | 3 refills | Status: AC

## 2024-07-27 ENCOUNTER — Encounter: Payer: Self-pay | Admitting: Urgent Care

## 2024-07-27 DIAGNOSIS — M170 Bilateral primary osteoarthritis of knee: Secondary | ICD-10-CM

## 2024-07-28 MED ORDER — CELECOXIB 200 MG PO CAPS: ORAL_CAPSULE | ORAL | 3 refills | Status: AC

## 2024-08-10 ENCOUNTER — Telehealth: Payer: Self-pay

## 2024-08-10 NOTE — Telephone Encounter (Signed)
 John,  Please review this patient's chart due to hx of VP (ventriculoperitoneal) shunt status; Please advise if the patient is cleared for care at [facility]; Please/thank you Bre, PV RN

## 2024-08-11 NOTE — Telephone Encounter (Signed)
 Thank you----will note this on the PV chart

## 2024-08-18 ENCOUNTER — Ambulatory Visit

## 2024-08-18 ENCOUNTER — Encounter: Payer: Self-pay | Admitting: Pediatrics

## 2024-08-18 VITALS — Ht 67.0 in | Wt 175.0 lb

## 2024-08-18 DIAGNOSIS — Z1211 Encounter for screening for malignant neoplasm of colon: Secondary | ICD-10-CM

## 2024-08-18 MED ORDER — NA SULFATE-K SULFATE-MG SULF 17.5-3.13-1.6 GM/177ML PO SOLN: 1.0000 | Freq: Once | ORAL | 0 refills | Status: AC

## 2024-08-18 NOTE — Progress Notes (Signed)
 Pre visit completed via video call; Patient verified name, DOB, and address; No egg or soy allergy known to patient;  No issues known to pt with past sedation with any surgeries or procedures; Patient denies ever being told they had issues or difficulty with intubation;  No FH of Malignant Hyperthermia; Pt is not on diet pills; Pt is not on home 02;  Pt is not on blood thinners;  Pt denies issues with constipation;  No A fib or A flutter; Have any cardiac testing pending--NO Insurance verified during PV appt--- U[employer] Pt can ambulate without assistance;  Pt denies use of chewing tobacco Discussed diabetic/weight loss medication holds; Discussed NSAID holds; Checked BMI to be less than 50; Pt instructed to use [employer] or GoodRx for a price reduction on prep;  Patient's chart reviewed by Norleen Schillings CNRA prior to previsit and patient appropriate for the [facility]; Pre visit completed and red dot placed by patient's name on their procedure day (on provider's schedule);   Instructions sent to MyChart per patient request;

## 2024-09-01 ENCOUNTER — Encounter: Admitting: Pediatrics

## 2024-09-06 ENCOUNTER — Encounter: Admitting: Pediatrics

## 2025-05-16 ENCOUNTER — Encounter: Admitting: Urgent Care
